# Patient Record
Sex: Female | Born: 1960
Health system: Southern US, Community
[De-identification: ages and names within clinical notes are randomized; demographics above are authoritative.]

## PROBLEM LIST (undated history)

## (undated) DIAGNOSIS — M797 Fibromyalgia: Secondary | ICD-10-CM

## (undated) DIAGNOSIS — E7800 Pure hypercholesterolemia, unspecified: Secondary | ICD-10-CM

## (undated) DIAGNOSIS — I10 Essential (primary) hypertension: Secondary | ICD-10-CM

## (undated) DIAGNOSIS — N12 Tubulo-interstitial nephritis, not specified as acute or chronic: Secondary | ICD-10-CM

## (undated) HISTORY — PX: ABDOMINAL HYSTERECTOMY: SHX81

---

## 2001-03-09 ENCOUNTER — Emergency Department (HOSPITAL_COMMUNITY): Admission: EM | Admit: 2001-03-09 | Discharge: 2001-03-09 | Payer: Self-pay | Admitting: *Deleted

## 2001-03-09 ENCOUNTER — Encounter: Payer: Self-pay | Admitting: *Deleted

## 2002-07-17 ENCOUNTER — Encounter: Payer: Self-pay | Admitting: Emergency Medicine

## 2002-07-17 ENCOUNTER — Emergency Department (HOSPITAL_COMMUNITY): Admission: EM | Admit: 2002-07-17 | Discharge: 2002-07-17 | Payer: Self-pay | Admitting: Emergency Medicine

## 2003-06-15 ENCOUNTER — Encounter: Admission: RE | Admit: 2003-06-15 | Discharge: 2003-06-15 | Payer: Self-pay | Admitting: Family Medicine

## 2007-03-12 ENCOUNTER — Encounter: Admission: RE | Admit: 2007-03-12 | Discharge: 2007-03-12 | Payer: Self-pay | Admitting: Family Medicine

## 2008-03-17 ENCOUNTER — Encounter: Admission: RE | Admit: 2008-03-17 | Discharge: 2008-03-17 | Payer: Self-pay | Admitting: Obstetrics & Gynecology

## 2009-03-20 ENCOUNTER — Encounter: Admission: RE | Admit: 2009-03-20 | Discharge: 2009-03-20 | Payer: Self-pay | Admitting: Family Medicine

## 2009-03-30 ENCOUNTER — Encounter: Admission: RE | Admit: 2009-03-30 | Discharge: 2009-03-30 | Payer: Self-pay | Admitting: Obstetrics & Gynecology

## 2010-03-26 ENCOUNTER — Encounter: Admission: RE | Admit: 2010-03-26 | Discharge: 2010-03-26 | Payer: Self-pay | Admitting: Obstetrics & Gynecology

## 2012-02-10 ENCOUNTER — Observation Stay (HOSPITAL_COMMUNITY)
Admission: EM | Admit: 2012-02-10 | Discharge: 2012-02-12 | Disposition: A | Discharge status: Home / Self Care | Payer: BC Managed Care – PPO | Attending: Internal Medicine | Admitting: Internal Medicine

## 2012-02-10 DIAGNOSIS — R4182 Altered mental status, unspecified: Principal | ICD-10-CM | POA: Insufficient documentation

## 2012-02-10 DIAGNOSIS — R4701 Aphasia: Secondary | ICD-10-CM | POA: Insufficient documentation

## 2012-02-10 DIAGNOSIS — E785 Hyperlipidemia, unspecified: Secondary | ICD-10-CM | POA: Insufficient documentation

## 2012-02-10 DIAGNOSIS — I6789 Other cerebrovascular disease: Secondary | ICD-10-CM | POA: Insufficient documentation

## 2012-02-10 DIAGNOSIS — IMO0001 Reserved for inherently not codable concepts without codable children: Secondary | ICD-10-CM

## 2012-02-10 DIAGNOSIS — R569 Unspecified convulsions: Secondary | ICD-10-CM | POA: Diagnosis present

## 2012-02-10 HISTORY — DX: Fibromyalgia: M79.7

## 2012-02-10 NOTE — ED Notes (Signed)
Dr. Dierdre Highman is aware of the patient's presentation.

## 2012-02-10 NOTE — ED Notes (Signed)
Per EMS, the patient's son said that the patient was dressing a wound on his knee when she said she had to go to the bathroom.  Sometime thereafter, the patient's family found her on the floor or the bathroom intermittently breathing rapidly and holding her breath while fluttering her eyes.

## 2012-02-10 NOTE — ED Notes (Signed)
The patient's family says she has no history of having had this problem in the past.

## 2012-02-10 NOTE — ED Notes (Signed)
The patient's son says that the patient has no mental health or neurological history.

## 2012-02-11 ENCOUNTER — Emergency Department (HOSPITAL_COMMUNITY): Payer: BC Managed Care – PPO

## 2012-02-11 ENCOUNTER — Observation Stay (HOSPITAL_COMMUNITY): Payer: BC Managed Care – PPO

## 2012-02-11 ENCOUNTER — Encounter (HOSPITAL_COMMUNITY): Payer: Self-pay | Admitting: *Deleted

## 2012-02-11 DIAGNOSIS — R030 Elevated blood-pressure reading, without diagnosis of hypertension: Secondary | ICD-10-CM

## 2012-02-11 DIAGNOSIS — IMO0001 Reserved for inherently not codable concepts without codable children: Secondary | ICD-10-CM | POA: Diagnosis present

## 2012-02-11 DIAGNOSIS — R4182 Altered mental status, unspecified: Secondary | ICD-10-CM | POA: Diagnosis present

## 2012-02-11 DIAGNOSIS — R569 Unspecified convulsions: Secondary | ICD-10-CM

## 2012-02-11 DIAGNOSIS — R4701 Aphasia: Secondary | ICD-10-CM | POA: Diagnosis present

## 2012-02-11 LAB — CBC
Hemoglobin: 12.6 g/dL (ref 12.0–15.0)
MCH: 28.2 pg (ref 26.0–34.0)
MCHC: 34.9 g/dL (ref 30.0–36.0)
MCV: 80.8 fL (ref 78.0–100.0)
Platelets: 238 10*3/uL (ref 150–400)

## 2012-02-11 LAB — COMPREHENSIVE METABOLIC PANEL
CO2: 26 mEq/L (ref 19–32)
Calcium: 9.3 mg/dL (ref 8.4–10.5)
GFR calc Af Amer: >90 mL/min (ref >90)
GFR calc non Af Amer: 84 mL/min — ABNORMAL LOW (ref >90)
Glucose, Bld: 123 mg/dL — ABNORMAL HIGH (ref 70–99)
Potassium: 3.5 mEq/L (ref 3.5–5.1)
Sodium: 140 mEq/L (ref 135–145)
Total Protein: 7 g/dL (ref 6.0–8.3)

## 2012-02-11 LAB — URINALYSIS, ROUTINE W REFLEX MICROSCOPIC
Hgb urine dipstick: NEGATIVE
Ketones, ur: NEGATIVE mg/dL
Leukocytes, UA: NEGATIVE
Nitrite: NEGATIVE
Protein, ur: NEGATIVE mg/dL
Urobilinogen, UA: 0.2 mg/dL (ref 0.0–1.0)

## 2012-02-11 LAB — RAPID URINE DRUG SCREEN, HOSP PERFORMED: Amphetamines: NOT DETECTED

## 2012-02-11 LAB — CK: Total CK: 114 U/L (ref 7–177)

## 2012-02-11 MED ORDER — SODIUM CHLORIDE 0.9 % IJ SOLN
3.0000 mL | Freq: Two times a day (BID) | INTRAMUSCULAR | Status: DC
  Administered 2012-02-12: 3 mL via INTRAVENOUS

## 2012-02-11 MED ORDER — SODIUM CHLORIDE 0.9 % IV SOLN
INTRAVENOUS | Status: DC
  Administered 2012-02-11: 01:00:00 via INTRAVENOUS

## 2012-02-11 MED ORDER — SODIUM CHLORIDE 0.9 % IJ SOLN: 3.0000 mL | INTRAMUSCULAR | Status: DC | PRN

## 2012-02-11 MED ORDER — ONDANSETRON HCL 4 MG PO TABS: 4.0000 mg | ORAL_TABLET | Freq: Four times a day (QID) | ORAL | Status: DC | PRN

## 2012-02-11 MED ORDER — SODIUM CHLORIDE 0.9 % IJ SOLN
3.0000 mL | Freq: Two times a day (BID) | INTRAMUSCULAR | Status: DC
  Administered 2012-02-11 – 2012-02-12 (×2): 3 mL via INTRAVENOUS

## 2012-02-11 MED ORDER — ONDANSETRON HCL 4 MG/2ML IJ SOLN: 4.0000 mg | Freq: Four times a day (QID) | INTRAMUSCULAR | Status: DC | PRN

## 2012-02-11 MED ORDER — LORAZEPAM 2 MG/ML IJ SOLN
1.0000 mg | Freq: Once | INTRAMUSCULAR | Status: AC
  Administered 2012-02-12: 1 mg via INTRAVENOUS
  Filled 2012-02-11: qty 1

## 2012-02-11 MED ORDER — SODIUM CHLORIDE 0.9 % IV SOLN: 250.0000 mL | INTRAVENOUS | Status: DC | PRN

## 2012-02-11 NOTE — Progress Notes (Signed)
Utilization review complete 

## 2012-02-11 NOTE — ED Notes (Signed)
Patient transported to CT 

## 2012-02-11 NOTE — ED Provider Notes (Signed)
History     CSN: [id number]  Arrival date & time 02/10/12  2325   First MD Initiated Contact with Patient 02/11/12 0030      Chief Complaint  Patient presents with  . Altered Mental Status    (Consider location/radiation/quality/duration/timing/severity/associated sxs/prior treatment) HPI HX per family bedside and limited history per PT.  AT home tonight watching TV and after 10pm was changing her son's knee bandages when she became nauseated and went to the bathroom.  Her husband found her in the bathroom on the floor, some twitchng and questionable shaking activity and unable to speak.  EMS was called and brought to the ED. PT able to shake her head Y/N and text with her family but anble to speak.  She does not recall events since being the bathroom. She denies HA, weakness, numbness, or any h/o same.  No past medical history on file.  No past surgical history on file.  No family history on file.  History  Substance Use Topics  . Smoking status: Not on file  . Smokeless tobacco: Not on file  . Alcohol Use: Not on file    OB History    No data available      Review of Systems  Constitutional: Negative for fever and chills.  HENT: Negative for neck pain and neck stiffness.   Eyes: Negative for pain.  Respiratory: Negative for shortness of breath.   Cardiovascular: Negative for chest pain.  Gastrointestinal: Negative for abdominal pain.  Genitourinary: Negative for dysuria.  Musculoskeletal: Negative for back pain.  Skin: Negative for rash.  Neurological: Positive for speech difficulty. Negative for weakness and headaches.  All other systems reviewed and are negative.    Allergies  Amoxicillin  Home Medications  No current outpatient prescriptions on file.  BP 146/77  Pulse 89  Temp 99 F (37.2 C) (Oral)  Resp 15  Ht 5' 2.5" (1.588 m)  Wt 130 lb (58.968 kg)  BMI 23.40 kg/m2  SpO2 99%  Physical Exam  Constitutional: She appears well-developed and  well-nourished.  HENT:  Head: Normocephalic and atraumatic.  Eyes: Conjunctivae normal and EOM are normal. Pupils are equal, round, and reactive to light.  Neck: Trachea normal. Neck supple. No thyromegaly present.  Cardiovascular: Normal rate, regular rhythm, S1 normal, S2 normal and normal pulses.     No systolic murmur is present   No diastolic murmur is present  Pulses:      Radial pulses are 2+ on the right side, and 2+ on the left side.  Pulmonary/Chest: Effort normal and breath sounds normal. She has no wheezes. She has no rhonchi. She has no rales. She exhibits no tenderness.  Abdominal: Soft. Normal appearance and bowel sounds are normal. There is no tenderness. There is no CVA tenderness and negative Murphy's sign.  Musculoskeletal:       MAE x 4 without deficit  Neurological: She is alert. She has normal strength. She displays a negative Romberg sign. Coordination normal. GCS eye subscore is 4. GCS verbal subscore is 1. GCS motor subscore is 6.       Other than unable to speak, has normal neuro exam. She is able to shake her head yes and no to appropriately answer questions, she is actively texting bedside with her family.   Skin: Skin is warm and dry. No rash noted. She is not diaphoretic.    ED Course  Procedures (including critical care time)   Results for orders placed during the hospital encounter of  02/10/12  URINALYSIS, ROUTINE W REFLEX MICROSCOPIC      Component Value Range   Color, Urine YELLOW  YELLOW   APPearance CLEAR  CLEAR   Specific Gravity, Urine 1.011  1.005 - 1.030   pH 6.0  5.0 - 8.0   Glucose, UA NEGATIVE  NEGATIVE mg/dL   Hgb urine dipstick NEGATIVE  NEGATIVE   Bilirubin Urine NEGATIVE  NEGATIVE   Ketones, ur NEGATIVE  NEGATIVE mg/dL   Protein, ur NEGATIVE  NEGATIVE mg/dL   Urobilinogen, UA 0.2  0.0 - 1.0 mg/dL   Nitrite NEGATIVE  NEGATIVE   Leukocytes, UA NEGATIVE  NEGATIVE  URINE RAPID DRUG SCREEN (HOSP PERFORMED)      Component Value Range    Opiates NONE DETECTED  NONE DETECTED   Cocaine NONE DETECTED  NONE DETECTED   Benzodiazepines NONE DETECTED  NONE DETECTED   Amphetamines NONE DETECTED  NONE DETECTED   Tetrahydrocannabinol NONE DETECTED  NONE DETECTED   Barbiturates NONE DETECTED  NONE DETECTED  CBC      Component Value Range   WBC 4.9  4.0 - 10.5 K/uL   RBC 4.47  3.87 - 5.11 MIL/uL   Hemoglobin 12.6  12.0 - 15.0 g/dL   HCT 82.9  56.2 - 13.0 %   MCV 80.8  78.0 - 100.0 fL   MCH 28.2  26.0 - 34.0 pg   MCHC 34.9  30.0 - 36.0 g/dL   RDW 86.5  78.4 - 69.6 %   Platelets 238  150 - 400 K/uL  COMPREHENSIVE METABOLIC PANEL      Component Value Range   Sodium 140  135 - 145 mEq/L   Potassium 3.5  3.5 - 5.1 mEq/L   Chloride 104  96 - 112 mEq/L   CO2 26  19 - 32 mEq/L   Glucose, Bld 123 (*) 70 - 99 mg/dL   BUN 9  6 - 23 mg/dL   Creatinine, Ser 2.95  0.50 - 1.10 mg/dL   Calcium 9.3  8.4 - 28.4 mg/dL   Total Protein 7.0  6.0 - 8.3 g/dL   Albumin 4.3  3.5 - 5.2 g/dL   AST 16  0 - 37 U/L   ALT 11  0 - 35 U/L   Alkaline Phosphatase 81  39 - 117 U/L   Total Bilirubin 0.6  0.3 - 1.2 mg/dL   GFR calc non Af Amer 84 (*) >90 mL/min   GFR calc Af Amer >90  >90 mL/min   Ct Head Wo Contrast  02/11/2012  *RADIOLOGY REPORT*  Clinical Data: Altered mental status.  Possible seizure.  Found down.  CT HEAD WITHOUT CONTRAST  Technique:  Contiguous axial images were obtained from the base of the skull through the vertex without contrast.  Comparison: None.  Findings: No mass lesion, mass effect, midline shift, hydrocephalus, hemorrhage.  No territorial ischemia or acute infarction.  Paranasal sinuses appear within normal limits.  IMPRESSION: Negative CT head.   Original Report Authenticated By: Andreas Newport, M.D.    Dg Chest Portable 1 View  02/11/2012  *RADIOLOGY REPORT*  Clinical Data: 51 year old female with altered mental status.  PORTABLE CHEST - 1 VIEW  Comparison: None.  Findings: Portable semi upright AP view 0011 hours.  Normal  lung volumes.  Cardiac size and mediastinal contours are within normal limits.  Visualized tracheal air column is within normal limits. EKG leads and wires overlie the chest.  No pneumothorax.  Allowing for portable technique, the lungs are clear.  IMPRESSION: Negative, no acute cardiopulmonary abnormality.   Original Report Authenticated By: Harley Hallmark, M.D.      Date: 02/11/2012  Rate: 98  Rhythm: normal sinus rhythm  QRS Axis: normal  Intervals: normal  ST/T Wave abnormalities: nonspecific ST changes  Conduction Disutrbances:none  Narrative Interpretation:   Old EKG Reviewed: none available  1:29 AM exam unchanged  Not a tPA candidate given possible seizure activity.   D/w NEU Dr Amada Jupiter on call, evaluated bedside and recs admit for MRI and EEG in the am.   2:17 AM d/w Dr Onalee Hua- will admit tele  MDM   Otherwise healthy 51 yo female with loss of speech after possible seizure. VS and nursing notes reviewed. UA/ UDS. Labs CT brain and CXR reviewed as above. NEU consult and MED admit.         Sunnie Nielsen, MD 02/11/12 [phone]

## 2012-02-11 NOTE — H&P (Signed)
PCP:   Ailene Ravel, MD   Chief Complaint:  seizure  HPI: 51 yo female healthy got nauseated earlier went to bathroom to vomit but didn't then started shaking all over and was found down on ground by husband and dtr.  No urinary or bowel incont.  No bites of tongue.  ems called came to ED and was aphasic for several hours but was able to text with family members in room by phone.  Several hours later i saw pt and her speech/voice was back to normal.  She recalls specifics of everything that happened during the seizure like activity tonight.  She recalls the sequence of events vividly.  No recent illness/ no fevers/diarrhea.  No sob/cp/rashes.  Review of Systems:  O/w neg  Past Medical History: none   Medications: Prior to Admission medications   Not on File    Allergies:   Allergies  Allergen Reactions  . Amoxicillin Hives    Social History:  does not have a smoking history on file. She does not have any smokeless tobacco history on file. Her alcohol and drug histories not on file.  Family History: neg  Physical Exam: Filed Vitals:   02/10/12 2332 02/10/12 2336 02/10/12 2345 02/10/12 2356  BP:  143/89 146/77   Pulse:  97 89   Temp:  99 F (37.2 C)    TempSrc:  Oral    Resp:  15 15   Height:    5' 2.5" (1.588 m)  Weight:    58.968 kg (130 lb)  SpO2: 98% 99% 99%    General appearance: alert, cooperative and no distress Neck: no carotid bruit and no JVD Lungs: clear to auscultation bilaterally Heart: regular rate and rhythm, S1, S2 normal, no murmur, click, rub or gallop Abdomen: soft, non-tender; bowel sounds normal; no masses,  no organomegaly Extremities: extremities normal, atraumatic, no cyanosis or edema Pulses: 2+ and symmetric Skin: Skin color, texture, turgor normal. No rashes or lesions Neurologic: Grossly normal    Labs on Admission:   [facility] 02/11/12 0009  NA 140  K 3.5  CL 104  CO2 26  GLUCOSE 123*  BUN 9  CREATININE 0.80  CALCIUM  9.3  MG --  PHOS --    Basename 02/11/12 0009  AST 16  ALT 11  ALKPHOS 81  BILITOT 0.6  PROT 7.0  ALBUMIN 4.3    Basename 02/11/12 0009  WBC 4.9  NEUTROABS --  HGB 12.6  HCT 36.1  MCV 80.8  PLT 238   Radiological Exams on Admission: Ct Head Wo Contrast  02/11/2012  *RADIOLOGY REPORT*  Clinical Data: Altered mental status.  Possible seizure.  Found down.  CT HEAD WITHOUT CONTRAST  Technique:  Contiguous axial images were obtained from the base of the skull through the vertex without contrast.  Comparison: None.  Findings: No mass lesion, mass effect, midline shift, hydrocephalus, hemorrhage.  No territorial ischemia or acute infarction.  Paranasal sinuses appear within normal limits.  IMPRESSION: Negative CT head.   Original Report Authenticated By: Andreas Newport, M.D.    Dg Chest Portable 1 View  02/11/2012  *RADIOLOGY REPORT*  Clinical Data: 51 year old female with altered mental status.  PORTABLE CHEST - 1 VIEW  Comparison: None.  Findings: Portable semi upright AP view 0011 hours.  Normal lung volumes.  Cardiac size and mediastinal contours are within normal limits.  Visualized tracheal air column is within normal limits. EKG leads and wires overlie the chest.  No pneumothorax.  Allowing for portable  technique, the lungs are clear.  IMPRESSION: Negative, no acute cardiopulmonary abnormality.   Original Report Authenticated By: Harley Hallmark, M.D.     Assessment/Plan Present on Admission:  51 yo female with? Seizure like activity and aphasia now resolved .Aphasia .Observed seizure-like activity .Altered mental state .Elevated BP  Neuro eval underway.  W/u per neuro.  No focal def at this time.  Danuel Felicetti [phone] 02/11/2012, 2:37 AM

## 2012-02-11 NOTE — ED Notes (Signed)
MD at bedside. 

## 2012-02-11 NOTE — Consult Note (Signed)
Reason for Consult: Inability to speak.  Referring Physician: Sunnie Nielsen  CC: Inability to Speak.   History is obtained from:Patient, family  HPI: Cheyenne Hart is an 51 y.o. female who was in her normal state of health until a few hours ago at which point she had gone to the bathroom complaining of nausea. She was then seen to pass out and have shaking. She does not feel that she lost consciousness, but remembers the shaking throughout the event.  Since that time, she has been able to interact via texting, but has not been able to have any verbal output. She is able to nod and shake her head appropriately, she is also able to give finger symbols such as when asked to describe, and fingers I'm holding up.   She also has right-sided numbness.  ROS: An 11 point ROS was performed and is negative except as noted in the HPI.  Past medical history: None  Family History: No history of seizures Father died of MI  Social History: Tob: Denies.   Exam: Current vital signs: BP 146/77  Pulse 89  Temp 99 F (37.2 C) (Oral)  Resp 15  Ht 5' 2.5" (1.588 m)  Wt 58.968 kg (130 lb)  BMI 23.40 kg/m2  SpO2 99% Vital signs in last 24 hours: Temp:  [99 F (37.2 C)] 99 F (37.2 C) (09/23 2336) Pulse Rate:  [89-97] 89  (09/23 2345) Resp:  [15] 15  (09/23 2345) BP: (143-146)/(77-89) 146/77 mmHg (09/23 2345) SpO2:  [98 %-99 %] 99 % (09/23 2345) Weight:  [58.968 kg (130 lb)] 58.968 kg (130 lb) (09/23 2356)  General: In bed, typing on cell phone CV: Regular rate and rhythm Mental Status: Patient is awake, alert, able to answer yes or no questions appropriately with head shakes. Able to text quickly and accurately. Patient is able to name objects, understands quickly and appropriately follows commands.  When asked to try and mouth the word, she is unable to. She is also unable to phonate sounds.  Cranial Nerves: II: Visual Fields are full. Pupils are equal, round, and reactive to light.   Discs are difficult visual. III,IV, VI: EOMI without ptosis or diploplia.  V,VII: Facial sensation and movement are symmetric.  VIII: hearing is intact to voice X: Uvula elevates symmetrically XI: Shoulder shrug is symmetric. XII: tongue is midline without atrophy or fasciculations.  Motor: Tone is normal. Bulk is normal. 5/5 strength was present in all four extremities.  Sensory: Sensation is diminished to light touch and temperature in the right arm, leg, and face. She does NOT split the midline. Deep Tendon Reflexes: 2+ and symmetric in the biceps and patellae. Plantars: Toes are downgoing bilaterally.  Cerebellar: FNF  intact bilaterally Gait: Did not assess due to patient safety concerns in the setting of multiple ER monitors  I have reviewed labs in epic and the results pertinent to this consultation are: CMP, CBC, UA, UDS are all negative  I have reviewed the images obtained: CT head is negative  Impression: 51 year old female with abrupt onset of shaking, now with persistent inability to speak but no signs of aphasia given that she can communicate via texting without problem. The fact that she does have a right-sided hypesthesia makes me concerned that she could possibly have a speech apraxia from a left sided infarct. My suspicion, however, is that this represents conversion disorder. The shaking on both sides accompanied by no loss of consciousness and full memory is not consistent with seizure  activity.  Recommendations: 1) MRI brain 2) EEG 3) If these are normal, I do not feel that any further workup is necessary.    Ritta Slot, MD [facility] [phone]

## 2012-02-11 NOTE — Progress Notes (Signed)
[facility] PROGRESS NOTE  Cheyenne Hart WUJ:[id number] DOB: September 14, 1960 DOA: 02/10/2012 PCP: Ailene Ravel, MD  Assessment/Plan: Principal Problem:  *Observed seizure-like activity Active Problems:  Aphasia  Altered mental state  Elevated BP  1. Abrupt onset of shaking and inability to speak ??aphasia: Rule out CVA.? seizure like activity. Patient denies worse now or family history of seizures. Denies any stressors. Not started recently on any new medications. Presenting symptoms have resolved. Denies losing consciousness. Neurology consultation appreciated. Follow MRI brain and EEG. UA and UDS are negative. Will add CK to labs.  Code Status: Full Family Communication: Discussed directly with patient. Disposition Plan: Home when workup complete.   Brief narrative: 51 yo female healthy got nauseated earlier went to bathroom to vomit but didn't then started shaking all over and laid down on the floor and was found there by husband and dtr. No urinary or bowel incont. No bites of tongue. ems called came to ED and was aphasic for several hours but was able to text with family members in room by phone. Several hours later in the ED, while evaluating by admitting M.D., was back to normal. She recalls specifics of everything that happened during the seizure like activity. She recalls the sequence of events vividly.   Consultants:  Neurology  Procedures:  None  Antibiotics:  None  HPI/Subjective: Denies complaints. No further symptoms of admission.  Objective: Filed Vitals:   02/11/12 0330 02/11/12 0530 02/11/12 0647 02/11/12 0800  BP: 126/76 127/80 125/80 119/80  Pulse: 76 68 69 65  Temp:  97.9 F (36.6 C) 98.1 F (36.7 C) 97.9 F (36.6 C)  TempSrc:  Oral    Resp: 14 16 18 18   Height:   5' 2.5" (1.588 m)   Weight:   57.788 kg (127 lb 6.4 oz)   SpO2: 97% 100% 96% 98%   No intake or output data in the 24 hours ending 02/11/12 1144 Filed Weights   02/10/12 2356  02/11/12 0647  Weight: 58.968 kg (130 lb) 57.788 kg (127 lb 6.4 oz)    Exam:   General exam: Moderately built and nourished female lying comfortably supine in bed.  Respiratory system: Clear to auscultation. No increased work of breathing.  Cardiovascular system: S1 and S2 heard, regular rate and rhythm. No JVD, murmurs or pedal edema. Telemetry shows normal sinus rhythm.  Gastrointestinal system: Abdomen is nondistended, soft and nontender. Normal bowel sounds heard.  Central nervous system: Alert and oriented. No focal neurological deficits.  Extremities: Symmetric 5 x 5 power.  Psychiatry: Pleasant and cooperative.  Data Reviewed: Basic Metabolic Panel:  Lab 02/11/12 9562  NA 140  K 3.5  CL 104  CO2 26  GLUCOSE 123*  BUN 9  CREATININE 0.80  CALCIUM 9.3  MG --  PHOS --   Liver Function Tests:  Lab 02/11/12 0009  AST 16  ALT 11  ALKPHOS 81  BILITOT 0.6  PROT 7.0  ALBUMIN 4.3   No results found for this basename: LIPASE:5,AMYLASE:5 in the last 168 hours No results found for this basename: AMMONIA:5 in the last 168 hours CBC:  Lab 02/11/12 0009  WBC 4.9  NEUTROABS --  HGB 12.6  HCT 36.1  MCV 80.8  PLT 238   Cardiac Enzymes: No results found for this basename: CKTOTAL:5,CKMB:5,CKMBINDEX:5,TROPONINI:5 in the last 168 hours BNP (last 3 results) No results found for this basename: PROBNP:3 in the last 8760 hours CBG: No results found for this basename: GLUCAP:5 in the last 168 hours  No results found for this or any previous visit (from the past 240 hour(s)).   Studies: Ct Head Wo Contrast  02/11/2012  *RADIOLOGY REPORT*  Clinical Data: Altered mental status.  Possible seizure.  Found down.  CT HEAD WITHOUT CONTRAST  Technique:  Contiguous axial images were obtained from the base of the skull through the vertex without contrast.  Comparison: None.  Findings: No mass lesion, mass effect, midline shift, hydrocephalus, hemorrhage.  No territorial ischemia  or acute infarction.  Paranasal sinuses appear within normal limits.  IMPRESSION: Negative CT head.   Original Report Authenticated By: Andreas Newport, M.D.    Dg Chest Portable 1 View  02/11/2012  *RADIOLOGY REPORT*  Clinical Data: 51 year old female with altered mental status.  PORTABLE CHEST - 1 VIEW  Comparison: None.  Findings: Portable semi upright AP view 0011 hours.  Normal lung volumes.  Cardiac size and mediastinal contours are within normal limits.  Visualized tracheal air column is within normal limits. EKG leads and wires overlie the chest.  No pneumothorax.  Allowing for portable technique, the lungs are clear.  IMPRESSION: Negative, no acute cardiopulmonary abnormality.   Original Report Authenticated By: Harley Hallmark, M.D.     Scheduled Meds:    . LORazepam  1 mg Intravenous Once  . sodium chloride  3 mL Intravenous Q12H  . sodium chloride  3 mL Intravenous Q12H   Continuous Infusions:    . DISCONTD: sodium chloride 125 mL/hr at 02/11/12 0102     [facility]  [facility] Pager [phone]. If 8PM-8AM, please contact night-coverage at [email or web address], password [email or web address] 02/11/2012, 11:44 AM  LOS: 1 day

## 2012-02-11 NOTE — Progress Notes (Signed)
EEG completed as ordered.

## 2012-02-12 ENCOUNTER — Observation Stay (HOSPITAL_COMMUNITY): Payer: BC Managed Care – PPO

## 2012-02-12 LAB — POCT I-STAT, CHEM 8
BUN: 8 mg/dL (ref 6–23)
Calcium, Ion: 1.15 mmol/L (ref 1.12–1.23)
HCT: 37 % (ref 36.0–46.0)
TCO2: 24 mmol/L (ref 0–100)

## 2012-02-12 MED ORDER — ASPIRIN 81 MG PO TBEC: 81.0000 mg | DELAYED_RELEASE_TABLET | Freq: Every day | ORAL | Status: AC

## 2012-02-12 MED ORDER — SIMVASTATIN 10 MG PO TABS: 10.0000 mg | ORAL_TABLET | Freq: Every day | ORAL | Status: AC

## 2012-02-12 NOTE — Discharge Summary (Signed)
Physician Discharge Summary  Cheyenne Hart:[id number] DOB: March 04, 1961 DOA: 02/10/2012  PCP: Ailene Ravel, MD  Admit date: 02/10/2012 Discharge date: 02/12/2012   Discharge Diagnoses:  Episode of loss of consciousness Chronic small vessel disease on MRI of the brain Reported hyperlipidemia -- no LDL done during this admission History of vasovagal events   Discharge Condition: Good, neurologically intact, back to baseline  Diet recommendation: Heart healthy  Filed Weights   02/10/12 2356 02/11/12 0647  Weight: 58.968 kg (130 lb) 57.788 kg (127 lb 6.4 oz)    History of present illness:  51 yo female healthy got nauseated earlier went to bathroom to vomit but didn't then started shaking all over and was found down on ground by husband and dtr. No urinary or bowel incont. No bites of tongue. ems called came to ED and was aphasic for several hours but was able to text with family members in room by phone. Several hours later i saw pt and her speech/voice was back to normal. She recalls specifics of everything that happened during the seizure like activity tonight. She recalls the sequence of events vividly. No recent illness/ no fevers/diarrhea. No sob/cp/rashes.   Hospital Course:  1. episode of loss of consciousness at home - the patient was seen by neurology in the emergency room and their note clearly states that her episode is highly unlikely to be a seizure. Patient was observed in the hospital for 48 hours without any loss of consciousness. She remained neurologically intact without any focal deficits. In retrospect the episode was probably a vasovagal event with superimposed stress reaction. 2. Chronic small vessel disease observed on MRI of the brain - probably due to hyperlipidemia, which patient reports has been persistent for years. Recommended daily aspirin and statin  Procedures:  MRI brain  Consultations:  Neurology  Discharge Exam: Filed Vitals:   02/12/12 0000  02/12/12 0400 02/12/12 0742 02/12/12 1158  BP: 127/75 124/88 136/78 127/81  Pulse: 83 58 75 86  Temp: 98 F (36.7 C) 98.1 F (36.7 C) 98.5 F (36.9 C) 98.1 F (36.7 C)  TempSrc: Oral Oral Oral Oral  Resp: 15 15 16 16   Height:      Weight:      SpO2: 95% 99% 100% 100%    General: Alert and oriented x3 Cardiovascular: Regular rate and rhythm Respiratory: Clear to auscultation bilaterally  Discharge Instructions  Discharge Orders    Future Orders Please Complete By Expires   Diet - low sodium heart healthy      Increase activity slowly          Medication List     As of 02/12/2012  2:23 PM    TAKE these medications         aspirin 81 MG EC tablet   Take 1 tablet (81 mg total) by mouth daily. Swallow whole.      simvastatin 10 MG tablet   Commonly known as: ZOCOR   Take 1 tablet (10 mg total) by mouth at bedtime.           Follow-up Information    Follow up with Ailene Ravel, MD.   Contact information:   Dr. Burnell Blanks [address] [place] [address] [phone]           The results of significant diagnostics from this hospitalization (including imaging, microbiology, ancillary and laboratory) are listed below for reference.    Significant Diagnostic Studies: Ct Head Wo Contrast  02/11/2012  *RADIOLOGY REPORT*  Clinical Data: Altered mental status.  Possible seizure.  Found down.  CT HEAD WITHOUT CONTRAST  Technique:  Contiguous axial images were obtained from the base of the skull through the vertex without contrast.  Comparison: None.  Findings: No mass lesion, mass effect, midline shift, hydrocephalus, hemorrhage.  No territorial ischemia or acute infarction.  Paranasal sinuses appear within normal limits.  IMPRESSION: Negative CT head.   Original Report Authenticated By: Andreas Newport, M.D.    Mr Brain Wo Contrast  02/12/2012  *RADIOLOGY REPORT*  Clinical Data:  Seizure.  Difficulty talking.  MRI HEAD WITHOUT CONTRAST  Technique:   Multiplanar, multiecho pulse sequences of the brain and surrounding structures were obtained according to standard protocol without intravenous contrast.  Comparison: CT scan of the brain  02/11/2012.  Findings:  Gray-white matter differentiation is normal.  Partial empty sella is seen.  Clival signal is intact.  Cerebellar tonsils are at the level of the foramen magnum.  The odontoid process, the predental space and the prevertebral soft tissues are within normal limits.  No diffusion weighted abnormalities seen.  Axial FLAIR and T2-weighted images demonstrate a few scattered foci of signal hyperintensity in the subcortical white matter bifrontally, and the biparietal regions.  The para hippocampal regions of the temporal lobes demonstrate symmetrical signal and morphology.  Ventricles are normal.  Mastoids are clear.  The internal auditory canals are symmetrical in signal and morphology.  Flow voids are maintained in the major vessels at the cranial skull base.  There is mild thickening of the mucosa in the ethmoid air cells and the maxillary sinuses.  The visualized orbital contents are grossly normal.  No abnormal blood breakdown products seen on SPGR sequences.  IMPRESSION: 1. No evidence of acute ischemia. 2.  A few scattered nonspecific subcortical white matter signal hyperintensities supratentorially. Differential considerations would be areas of ischemic gliosis related to small vessel disease due to hypertension and/or diabetes versus a demyelinating process or vasculitis.  Similar changes may be seen with chronic vascular headaches. Clinical correlation  is suggested. 3.  Minimal inflammatory mucosal reaction in the paranasal sinuses.   Original Report Authenticated By: Oneal Grout, M.D.    Dg Chest Portable 1 View  02/11/2012  *RADIOLOGY REPORT*  Clinical Data: 51 year old female with altered mental status.  PORTABLE CHEST - 1 VIEW  Comparison: None.  Findings: Portable semi upright AP view  0011 hours.  Normal lung volumes.  Cardiac size and mediastinal contours are within normal limits.  Visualized tracheal air column is within normal limits. EKG leads and wires overlie the chest.  No pneumothorax.  Allowing for portable technique, the lungs are clear.  IMPRESSION: Negative, no acute cardiopulmonary abnormality.   Original Report Authenticated By: Harley Hallmark, M.D.     Microbiology: No results found for this or any previous visit (from the past 240 hour(s)).   Labs: Basic Metabolic Panel:  Lab 02/11/12 5573 02/11/12 0009  NA 141 140  K 3.7 3.5  CL 104 104  CO2 -- 26  GLUCOSE 117* 123*  BUN 8 9  CREATININE 0.90 0.80  CALCIUM -- 9.3  MG -- --  PHOS -- --   Liver Function Tests:  Lab 02/11/12 0009  AST 16  ALT 11  ALKPHOS 81  BILITOT 0.6  PROT 7.0  ALBUMIN 4.3   No results found for this basename: LIPASE:5,AMYLASE:5 in the last 168 hours No results found for this basename: AMMONIA:5 in the last 168 hours CBC:  Lab 02/11/12 0024  02/11/12 0009  WBC -- 4.9  NEUTROABS -- --  HGB 12.6 12.6  HCT 37.0 36.1  MCV -- 80.8  PLT -- 238   Cardiac Enzymes:  Lab 02/11/12 1351  CKTOTAL 114  CKMB --  CKMBINDEX --  TROPONINI --   BNP: BNP (last 3 results) No results found for this basename: PROBNP:3 in the last 8760 hours CBG: No results found for this basename: GLUCAP:5 in the last 168 hours  Time coordinating discharge: 40 minutes  Signed:  Letisha Yera  [facility] 02/12/2012, 2:23 PM

## 2012-02-12 NOTE — Procedures (Signed)
EEG  This is a  51 y/o female patient had nausea and then had loc and shaking but remembers the whole event including passing out and was shaking. She has no verbal output but able to text on her cell phone no signs of aphasia.  Shaking all over and no loss of consciousness and full memory intact does not correlate with seizure activity.  EEG was done to rule out any possible seizure focus because of inconsistency in the history  EEG: [place]  17 lead  DESCRIPTION: This is a routine EEG recording performed during  wakefulness.  Posterior dominant rhythm is approximately 8-9 hz alpha rhythm that is symmetrical. Photic stimulation was done and did provide a significant driving response to the EEG. Stage 2 sleep was captured as evidenced by K complex and Vertex sharp waves.  No epileptiform discharges or seizure like recorded.   INTERPRETATION: This EEG  Is normal wake, drowsy and sleep without the evidence of seizures or other abnormalities. Clinical correlation is highly suggested  Gergory Biello V-P Eilleen Kempf., MD., Ph.D.,MS 02/12/2012 6:22 PM

## 2012-02-12 NOTE — Progress Notes (Signed)
Pt discharged to home per MD order. Pt received all discharge instructions and medication information including follow-up appointments and prescriptions.  Pt alert and oriented at discharge with no complaints of pain.  Pt escorted to private vehicle via wheelchair by nurse tech. Coates, Ercia Crisafulli Elizabeth  

## 2012-08-24 ENCOUNTER — Other Ambulatory Visit: Payer: Self-pay | Admitting: Family Medicine

## 2012-08-24 DIAGNOSIS — Z1231 Encounter for screening mammogram for malignant neoplasm of breast: Secondary | ICD-10-CM

## 2012-09-28 ENCOUNTER — Ambulatory Visit: Payer: BC Managed Care – PPO

## 2012-10-05 ENCOUNTER — Ambulatory Visit: Payer: BC Managed Care – PPO

## 2012-10-29 ENCOUNTER — Other Ambulatory Visit: Payer: Self-pay | Admitting: Family Medicine

## 2012-10-29 DIAGNOSIS — N951 Menopausal and female climacteric states: Secondary | ICD-10-CM

## 2012-11-09 ENCOUNTER — Ambulatory Visit: Payer: BC Managed Care – PPO

## 2012-11-30 ENCOUNTER — Ambulatory Visit
Admission: RE | Admit: 2012-11-30 | Discharge: 2012-11-30 | Disposition: A | Discharge status: Home / Self Care | Payer: BC Managed Care – PPO | Source: Ambulatory Visit | Attending: Family Medicine | Admitting: Family Medicine

## 2012-11-30 DIAGNOSIS — N951 Menopausal and female climacteric states: Secondary | ICD-10-CM

## 2012-11-30 DIAGNOSIS — Z1231 Encounter for screening mammogram for malignant neoplasm of breast: Secondary | ICD-10-CM

## 2013-11-24 ENCOUNTER — Other Ambulatory Visit: Payer: Self-pay

## 2013-11-24 DIAGNOSIS — Z1231 Encounter for screening mammogram for malignant neoplasm of breast: Secondary | ICD-10-CM

## 2014-01-03 ENCOUNTER — Encounter (INDEPENDENT_AMBULATORY_CARE_PROVIDER_SITE_OTHER): Payer: Self-pay

## 2014-01-03 ENCOUNTER — Ambulatory Visit
Admission: RE | Admit: 2014-01-03 | Discharge: 2014-01-03 | Disposition: A | Discharge status: Home / Self Care | Payer: BC Managed Care – PPO | Source: Ambulatory Visit

## 2014-01-03 DIAGNOSIS — Z1231 Encounter for screening mammogram for malignant neoplasm of breast: Secondary | ICD-10-CM

## 2014-08-02 ENCOUNTER — Other Ambulatory Visit: Payer: Self-pay

## 2014-08-02 ENCOUNTER — Encounter (HOSPITAL_COMMUNITY): Payer: Self-pay | Admitting: Emergency Medicine

## 2014-08-02 ENCOUNTER — Emergency Department (HOSPITAL_COMMUNITY): Payer: BLUE CROSS/BLUE SHIELD

## 2014-08-02 ENCOUNTER — Emergency Department (HOSPITAL_COMMUNITY)
Admission: EM | Admit: 2014-08-02 | Discharge: 2014-08-02 | Disposition: A | Discharge status: Home / Self Care | Payer: BLUE CROSS/BLUE SHIELD | Attending: Emergency Medicine | Admitting: Emergency Medicine

## 2014-08-02 DIAGNOSIS — Z79899 Other long term (current) drug therapy: Secondary | ICD-10-CM | POA: Insufficient documentation

## 2014-08-02 DIAGNOSIS — Z7982 Long term (current) use of aspirin: Secondary | ICD-10-CM | POA: Insufficient documentation

## 2014-08-02 DIAGNOSIS — R079 Chest pain, unspecified: Secondary | ICD-10-CM | POA: Insufficient documentation

## 2014-08-02 DIAGNOSIS — M545 Low back pain: Secondary | ICD-10-CM | POA: Diagnosis present

## 2014-08-02 DIAGNOSIS — Z880 Allergy status to penicillin: Secondary | ICD-10-CM | POA: Insufficient documentation

## 2014-08-02 DIAGNOSIS — N12 Tubulo-interstitial nephritis, not specified as acute or chronic: Secondary | ICD-10-CM | POA: Diagnosis not present

## 2014-08-02 LAB — URINALYSIS, ROUTINE W REFLEX MICROSCOPIC
BILIRUBIN URINE: NEGATIVE
Glucose, UA: NEGATIVE mg/dL
KETONES UR: NEGATIVE mg/dL
NITRITE: POSITIVE — AB
PROTEIN: NEGATIVE mg/dL
Specific Gravity, Urine: 1.014 (ref 1.005–1.030)
UROBILINOGEN UA: 0.2 mg/dL (ref 0.0–1.0)
pH: 6.5 (ref 5.0–8.0)

## 2014-08-02 LAB — COMPREHENSIVE METABOLIC PANEL
ALK PHOS: 63 U/L (ref 39–117)
ALT: 17 U/L (ref 0–35)
ANION GAP: 9 (ref 5–15)
AST: 19 U/L (ref 0–37)
Albumin: 4.6 g/dL (ref 3.5–5.2)
BUN: 9 mg/dL (ref 6–23)
CO2: 27 mmol/L (ref 19–32)
CREATININE: 0.76 mg/dL (ref 0.50–1.10)
Calcium: 9.3 mg/dL (ref 8.4–10.5)
Chloride: 105 mmol/L (ref 96–112)
GFR calc non Af Amer: >90 mL/min (ref >90)
GLUCOSE: 117 mg/dL — AB (ref 70–99)
POTASSIUM: 4 mmol/L (ref 3.5–5.1)
Sodium: 141 mmol/L (ref 135–145)
TOTAL PROTEIN: 7.1 g/dL (ref 6.0–8.3)
Total Bilirubin: 0.7 mg/dL (ref 0.3–1.2)

## 2014-08-02 LAB — D-DIMER, QUANTITATIVE: D-Dimer, Quant: <0.27 ug/mL-FEU (ref 0.00–0.48)

## 2014-08-02 LAB — CBC
HEMATOCRIT: 38 % (ref 36.0–46.0)
HEMOGLOBIN: 12.7 g/dL (ref 12.0–15.0)
MCH: 27.8 pg (ref 26.0–34.0)
MCHC: 33.4 g/dL (ref 30.0–36.0)
MCV: 83.2 fL (ref 78.0–100.0)
Platelets: 199 10*3/uL (ref 150–400)
RBC: 4.57 MIL/uL (ref 3.87–5.11)
RDW: 12.9 % (ref 11.5–15.5)
WBC: 4 10*3/uL (ref 4.0–10.5)

## 2014-08-02 LAB — TROPONIN I

## 2014-08-02 LAB — LIPASE, BLOOD: LIPASE: 41 U/L (ref 11–59)

## 2014-08-02 LAB — URINE MICROSCOPIC-ADD ON

## 2014-08-02 MED ORDER — ONDANSETRON HCL 4 MG/2ML IJ SOLN
4.0000 mg | Freq: Once | INTRAMUSCULAR | Status: AC
  Administered 2014-08-02: 4 mg via INTRAVENOUS
  Filled 2014-08-02: qty 2

## 2014-08-02 MED ORDER — CIPROFLOXACIN HCL 500 MG PO TABS: 500.0000 mg | ORAL_TABLET | Freq: Two times a day (BID) | ORAL | Status: AC

## 2014-08-02 MED ORDER — DEXTROSE 5 % IV SOLN
1.0000 g | Freq: Once | INTRAVENOUS | Status: AC
  Administered 2014-08-02: 1 g via INTRAVENOUS
  Filled 2014-08-02: qty 10

## 2014-08-02 MED ORDER — HYDROCODONE-ACETAMINOPHEN 5-325 MG PO TABS: 1.0000 | ORAL_TABLET | Freq: Four times a day (QID) | ORAL | Status: AC | PRN

## 2014-08-02 MED ORDER — SODIUM CHLORIDE 0.9 % IV BOLUS (SEPSIS)
1000.0000 mL | Freq: Once | INTRAVENOUS | Status: AC
  Administered 2014-08-02: 1000 mL via INTRAVENOUS

## 2014-08-02 MED ORDER — ONDANSETRON 4 MG PO TBDP: 4.0000 mg | ORAL_TABLET | Freq: Three times a day (TID) | ORAL | Status: AC | PRN

## 2014-08-02 MED ORDER — DIPHENHYDRAMINE HCL 50 MG/ML IJ SOLN
25.0000 mg | Freq: Once | INTRAMUSCULAR | Status: AC
  Administered 2014-08-02: 25 mg via INTRAVENOUS
  Filled 2014-08-02: qty 1

## 2014-08-02 MED ORDER — PROCHLORPERAZINE EDISYLATE 5 MG/ML IJ SOLN
10.0000 mg | Freq: Four times a day (QID) | INTRAMUSCULAR | Status: DC | PRN
  Administered 2014-08-02: 10 mg via INTRAVENOUS
  Filled 2014-08-02: qty 2

## 2014-08-02 MED ORDER — MORPHINE SULFATE 4 MG/ML IJ SOLN
4.0000 mg | Freq: Once | INTRAMUSCULAR | Status: AC
  Administered 2014-08-02: 4 mg via INTRAVENOUS
  Filled 2014-08-02: qty 1

## 2014-08-02 NOTE — Discharge Instructions (Signed)
Pyelonephritis, Adult °Pyelonephritis is a kidney infection. A kidney infection can happen quickly, or it can last for a long time. °HOME CARE  °· Take your medicine (antibiotics) as told. Finish it even if you start to feel better. °· Keep all doctor visits as told. °· Drink enough fluids to keep your pee (urine) clear or pale yellow. °· Only take medicine as told by your doctor. °GET HELP RIGHT AWAY IF:  °· You have a fever or lasting symptoms for more than 2-3 days. °· You have a fever and your symptoms suddenly get worse. °· You cannot take your medicine or drink fluids as told. °· You have chills and shaking. °· You feel very weak or pass out (faint). °· You do not feel better after 2 days. °MAKE SURE YOU: °· Understand these instructions. °· Will watch your condition. °· Will get help right away if you are not doing well or get worse. °Document Released: 06/13/2004 Document Revised: 11/05/2011 Document Reviewed: 10/24/2010 °[employer]® Patient Information ©2015 [employer]. This information is not intended to replace advice given to you by your health care provider. Make sure you discuss any questions you have with your health care provider. ° °

## 2014-08-02 NOTE — ED Notes (Signed)
Pt presents with back pain that radiates into her chest since Sunday, admits to N/V.  Pt denies SOB, speaking in full sentences without distress.

## 2014-08-02 NOTE — ED Provider Notes (Signed)
CSN: [id number]     Arrival date & time 08/02/14  0306 History   First MD Initiated Contact with Patient 08/02/14 [phone]     Chief Complaint  Patient presents with  . Back Pain     (Consider location/radiation/quality/duration/timing/severity/associated sxs/prior Treatment) HPI  This is a 54 year old female with a history of fibromyalgia who presents with back pain. Patient reports right upper and left lower back pain onset on Sunday. She states that it is achy. It comes and goes. Currently she is pain-free but when she arrived is 10 out of 10. The upper back pain radiates into her chest. She reports nausea and vomiting. Denies any diarrhea or fevers. Denies any sick contacts. No known history of kidney stones and no urinary symptoms. No known history of heart disease. Denies history of hypertension or hyperlipidemia. She is a nonsmoker.  Past Medical History  Diagnosis Date  . Fibromyalgia    Past Surgical History  Procedure Laterality Date  . Abdominal hysterectomy     No family history on file. History  Substance Use Topics  . Smoking status: Never Smoker   . Smokeless tobacco: Never Used  . Alcohol Use: No   OB History    No data available     Review of Systems  Constitutional: Negative for fever.  Respiratory: Negative for cough, chest tightness and shortness of breath.   Cardiovascular: Positive for chest pain. Negative for leg swelling.  Gastrointestinal: Positive for nausea and vomiting. Negative for abdominal pain and diarrhea.  Genitourinary: Negative for dysuria.  Musculoskeletal: Positive for back pain.  Skin: Negative for wound.  Neurological: Negative for headaches.  Psychiatric/Behavioral: Negative for confusion.  All other systems reviewed and are negative.     Allergies  Amoxicillin  Home Medications   Prior to Admission medications   Medication Sig Start Date End Date Taking? Authorizing Provider  alendronate (FOSAMAX) 70 MG tablet Take 70 mg by  mouth once a week. Take with a full glass of water on an empty stomach.   Yes Historical Provider, MD  aspirin (ASPIRIN EC) 81 MG EC tablet Take 1 tablet (81 mg total) by mouth daily. Swallow whole. 02/12/12  Yes Sorin Luanne Bras, MD  cefUROXime (CEFTIN) 500 MG tablet Take 500 mg by mouth 2 (two) times daily.  08/01/14  Yes Historical Provider, MD  cetirizine (ZYRTEC) 10 MG tablet Take 10 mg by mouth daily.   Yes Historical Provider, MD  CHERATUSSIN AC 100-10 MG/5ML syrup Take 5 mLs by mouth 3 (three) times daily as needed for cough.  08/01/14  Yes Historical Provider, MD  simvastatin (ZOCOR) 10 MG tablet Take 1 tablet (10 mg total) by mouth at bedtime. 02/12/12  Yes Sorin Luanne Bras, MD  ciprofloxacin (CIPRO) 500 MG tablet Take 1 tablet (500 mg total) by mouth 2 (two) times daily. 08/02/14   Shon Baton, MD  HYDROcodone-acetaminophen (NORCO/VICODIN) 5-325 MG per tablet Take 1 tablet by mouth every 6 (six) hours as needed. 08/02/14   Shon Baton, MD  ondansetron (ZOFRAN ODT) 4 MG disintegrating tablet Take 1 tablet (4 mg total) by mouth every 8 (eight) hours as needed for nausea or vomiting. 08/02/14   Shon Baton, MD   BP 112/63 mmHg  Pulse 70  Temp(Src) 98 F (36.7 C) (Oral)  Resp 12  SpO2 99% Physical Exam  Constitutional: She is oriented to person, place, and time. No distress.  Ill-appearing but nontoxic  HENT:  Head: Normocephalic and atraumatic.  Eyes: Pupils are  equal, round, and reactive to light.  Cardiovascular: Normal rate, regular rhythm and normal heart sounds.   No murmur heard. Pulmonary/Chest: Effort normal and breath sounds normal. No respiratory distress. She has no wheezes.  Abdominal: Soft. Bowel sounds are normal. There is no tenderness. There is no rebound and no guarding.  Musculoskeletal:  No CVA tenderness, no lower lumbar or thoracic tenderness noted  Neurological: She is alert and oriented to person, place, and time.  Skin: Skin is warm and dry.   Psychiatric: She has a normal mood and affect.  Nursing note and vitals reviewed.   ED Course  Procedures (including critical care time) Labs Review Labs Reviewed  COMPREHENSIVE METABOLIC PANEL - Abnormal; Notable for the following:    Glucose, Bld 117 (*)    All other components within normal limits  URINALYSIS, ROUTINE W REFLEX MICROSCOPIC - Abnormal; Notable for the following:    APPearance CLOUDY (*)    Hgb urine dipstick TRACE (*)    Nitrite POSITIVE (*)    Leukocytes, UA LARGE (*)    All other components within normal limits  URINE MICROSCOPIC-ADD ON - Abnormal; Notable for the following:    Bacteria, UA MANY (*)    All other components within normal limits  URINE CULTURE  CBC  TROPONIN I  LIPASE, BLOOD  D-DIMER, QUANTITATIVE  I-STAT TROPOININ, ED    Imaging Review Dg Chest 2 View  08/02/2014   CLINICAL DATA:  Upper back pain  EXAM: CHEST  2 VIEW  COMPARISON:  02/11/2012  FINDINGS: The heart size and mediastinal contours are within normal limits. Both lungs are clear. The visualized skeletal structures are unremarkable.  IMPRESSION: No active cardiopulmonary disease.   Electronically Signed   By: Ellery Plunkaniel R Mitchell M.D.   On: 08/02/2014 04:28     EKG Interpretation  Normal sinus rhythm with a rate of 78, no acute ST elevation or ischemia noted      MDM   Final diagnoses:  Pyelonephritis    Patient presents with back pain, nausea, and vomiting. Retching on my evaluation. No significant CVA tenderness. Lab work obtained. Patient given pain and nausea medication.  9:44 AM Patient reports improvement of nausea but has vomited several additional times.. However, now complaining of headache. History of migraines. Headache is behind the right eye and consistent with prior migraines. Patient given headache cocktail. Initial labwork reassuring. Patient to provide urine.  10:40 AM Urine nitrite positive. Suspect patient has early pyelonephritis. No fever or  leukocytosis but given vomiting and back pain, will treat for upper urinary tract infection. Patient given IV Rocephin.  12:32 PM On recheck, patient reports improvement. She is resting comfortably. She is able to tolerate fluids. Will discharge on Cipro twice a day for 7 days. Patient to follow-up with her physician. Patient given return precautions.  After history, exam, and medical workup I feel the patient has been appropriately medically screened and is safe for discharge home. Pertinent diagnoses were discussed with the patient. Patient was given return precautions.   Shon Batonourtney F Moriya Mitchell, MD 08/02/14 [phone]

## 2014-08-04 LAB — URINE CULTURE: Colony Count: >=100000

## 2014-12-05 ENCOUNTER — Other Ambulatory Visit: Payer: Self-pay

## 2014-12-05 DIAGNOSIS — Z1231 Encounter for screening mammogram for malignant neoplasm of breast: Secondary | ICD-10-CM

## 2014-12-06 ENCOUNTER — Other Ambulatory Visit: Payer: Self-pay | Admitting: Family Medicine

## 2014-12-06 DIAGNOSIS — N951 Menopausal and female climacteric states: Secondary | ICD-10-CM

## 2015-01-06 ENCOUNTER — Ambulatory Visit
Admission: RE | Admit: 2015-01-06 | Discharge: 2015-01-06 | Disposition: A | Discharge status: Home / Self Care | Payer: BLUE CROSS/BLUE SHIELD | Source: Ambulatory Visit

## 2015-01-06 ENCOUNTER — Ambulatory Visit
Admission: RE | Admit: 2015-01-06 | Discharge: 2015-01-06 | Disposition: A | Discharge status: Home / Self Care | Payer: BLUE CROSS/BLUE SHIELD | Source: Ambulatory Visit | Attending: Family Medicine | Admitting: Family Medicine

## 2015-01-06 DIAGNOSIS — N951 Menopausal and female climacteric states: Secondary | ICD-10-CM

## 2015-01-06 DIAGNOSIS — Z1231 Encounter for screening mammogram for malignant neoplasm of breast: Secondary | ICD-10-CM

## 2015-11-29 ENCOUNTER — Other Ambulatory Visit: Payer: Self-pay | Admitting: Family Medicine

## 2015-11-29 DIAGNOSIS — Z79899 Other long term (current) drug therapy: Secondary | ICD-10-CM | POA: Diagnosis not present

## 2015-11-29 DIAGNOSIS — Z1231 Encounter for screening mammogram for malignant neoplasm of breast: Secondary | ICD-10-CM

## 2015-11-29 DIAGNOSIS — M810 Age-related osteoporosis without current pathological fracture: Secondary | ICD-10-CM | POA: Diagnosis not present

## 2015-11-29 DIAGNOSIS — E538 Deficiency of other specified B group vitamins: Secondary | ICD-10-CM | POA: Diagnosis not present

## 2015-11-29 DIAGNOSIS — E7800 Pure hypercholesterolemia, unspecified: Secondary | ICD-10-CM | POA: Diagnosis not present

## 2015-12-14 ENCOUNTER — Emergency Department (HOSPITAL_BASED_OUTPATIENT_CLINIC_OR_DEPARTMENT_OTHER): Payer: No Typology Code available for payment source

## 2015-12-14 ENCOUNTER — Encounter (HOSPITAL_BASED_OUTPATIENT_CLINIC_OR_DEPARTMENT_OTHER): Payer: Self-pay | Admitting: *Deleted

## 2015-12-14 ENCOUNTER — Emergency Department (HOSPITAL_BASED_OUTPATIENT_CLINIC_OR_DEPARTMENT_OTHER)
Admission: EM | Admit: 2015-12-14 | Discharge: 2015-12-14 | Disposition: A | Discharge status: Home / Self Care | Payer: No Typology Code available for payment source | Attending: Emergency Medicine | Admitting: Emergency Medicine

## 2015-12-14 DIAGNOSIS — M542 Cervicalgia: Secondary | ICD-10-CM | POA: Insufficient documentation

## 2015-12-14 DIAGNOSIS — Y999 Unspecified external cause status: Secondary | ICD-10-CM | POA: Diagnosis not present

## 2015-12-14 DIAGNOSIS — Y9389 Activity, other specified: Secondary | ICD-10-CM | POA: Insufficient documentation

## 2015-12-14 DIAGNOSIS — Y92410 Unspecified street and highway as the place of occurrence of the external cause: Secondary | ICD-10-CM | POA: Insufficient documentation

## 2015-12-14 DIAGNOSIS — Z23 Encounter for immunization: Secondary | ICD-10-CM | POA: Diagnosis not present

## 2015-12-14 DIAGNOSIS — M25511 Pain in right shoulder: Secondary | ICD-10-CM | POA: Diagnosis not present

## 2015-12-14 DIAGNOSIS — M79601 Pain in right arm: Secondary | ICD-10-CM | POA: Diagnosis not present

## 2015-12-14 DIAGNOSIS — S50811A Abrasion of right forearm, initial encounter: Secondary | ICD-10-CM | POA: Insufficient documentation

## 2015-12-14 DIAGNOSIS — S59911A Unspecified injury of right forearm, initial encounter: Secondary | ICD-10-CM | POA: Diagnosis present

## 2015-12-14 DIAGNOSIS — T148 Other injury of unspecified body region: Secondary | ICD-10-CM | POA: Diagnosis not present

## 2015-12-14 DIAGNOSIS — Z79899 Other long term (current) drug therapy: Secondary | ICD-10-CM | POA: Insufficient documentation

## 2015-12-14 HISTORY — DX: Pure hypercholesterolemia, unspecified: E78.00

## 2015-12-14 MED ORDER — ACETAMINOPHEN 500 MG PO TABS
1000.0000 mg | ORAL_TABLET | Freq: Once | ORAL | Status: AC
  Administered 2015-12-14: 1000 mg via ORAL
  Filled 2015-12-14: qty 2

## 2015-12-14 MED ORDER — TETANUS-DIPHTH-ACELL PERTUSSIS 5-2.5-18.5 LF-MCG/0.5 IM SUSP
0.5000 mL | Freq: Once | INTRAMUSCULAR | Status: AC
  Administered 2015-12-14: 0.5 mL via INTRAMUSCULAR
  Filled 2015-12-14: qty 0.5

## 2015-12-14 MED ORDER — IBUPROFEN 800 MG PO TABS
800.0000 mg | ORAL_TABLET | Freq: Once | ORAL | Status: AC
  Administered 2015-12-14: 800 mg via ORAL
  Filled 2015-12-14: qty 1

## 2015-12-14 NOTE — ED Notes (Signed)
CMS intact before and after. Pt tolerated well. Pt had no questions.  
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
## 2015-12-14 NOTE — Discharge Instructions (Signed)
Take 4 over the counter ibuprofen tablets 3 times a day or 2 over-the-counter naproxen tablets twice a day for pain. Also take tylenol 1000mg(2 extra strength) four times a day.    

## 2015-12-14 NOTE — ED Notes (Signed)
Patient transported to X-ray 
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
## 2015-12-15 ENCOUNTER — Encounter (HOSPITAL_COMMUNITY): Payer: Self-pay | Admitting: Emergency Medicine

## 2016-01-10 ENCOUNTER — Ambulatory Visit
Admission: RE | Admit: 2016-01-10 | Discharge: 2016-01-10 | Disposition: A | Discharge status: Home / Self Care | Payer: BLUE CROSS/BLUE SHIELD | Source: Ambulatory Visit | Attending: Family Medicine | Admitting: Family Medicine

## 2016-01-10 ENCOUNTER — Ambulatory Visit: Payer: BLUE CROSS/BLUE SHIELD

## 2016-01-10 DIAGNOSIS — Z1231 Encounter for screening mammogram for malignant neoplasm of breast: Secondary | ICD-10-CM

## 2016-01-10 DIAGNOSIS — Z01419 Encounter for gynecological examination (general) (routine) without abnormal findings: Secondary | ICD-10-CM | POA: Diagnosis not present

## 2016-01-10 DIAGNOSIS — Z6824 Body mass index (BMI) 24.0-24.9, adult: Secondary | ICD-10-CM | POA: Diagnosis not present

## 2016-02-12 DIAGNOSIS — Z1211 Encounter for screening for malignant neoplasm of colon: Secondary | ICD-10-CM | POA: Diagnosis not present

## 2016-06-17 DIAGNOSIS — J019 Acute sinusitis, unspecified: Secondary | ICD-10-CM | POA: Diagnosis not present

## 2016-06-17 DIAGNOSIS — Z6823 Body mass index (BMI) 23.0-23.9, adult: Secondary | ICD-10-CM | POA: Diagnosis not present

## 2017-01-07 ENCOUNTER — Other Ambulatory Visit: Payer: Self-pay | Admitting: Physician Assistant

## 2017-01-07 DIAGNOSIS — E2839 Other primary ovarian failure: Secondary | ICD-10-CM

## 2017-01-07 DIAGNOSIS — Z1231 Encounter for screening mammogram for malignant neoplasm of breast: Secondary | ICD-10-CM

## 2017-01-31 ENCOUNTER — Ambulatory Visit
Admission: RE | Admit: 2017-01-31 | Discharge: 2017-01-31 | Disposition: A | Discharge status: Home / Self Care | Payer: BLUE CROSS/BLUE SHIELD | Source: Ambulatory Visit | Attending: Physician Assistant | Admitting: Physician Assistant

## 2017-01-31 DIAGNOSIS — E2839 Other primary ovarian failure: Secondary | ICD-10-CM

## 2017-01-31 DIAGNOSIS — Z1231 Encounter for screening mammogram for malignant neoplasm of breast: Secondary | ICD-10-CM | POA: Diagnosis not present

## 2017-01-31 DIAGNOSIS — Z780 Asymptomatic menopausal state: Secondary | ICD-10-CM | POA: Diagnosis not present

## 2017-01-31 DIAGNOSIS — M8589 Other specified disorders of bone density and structure, multiple sites: Secondary | ICD-10-CM | POA: Diagnosis not present

## 2017-02-11 DIAGNOSIS — Z1389 Encounter for screening for other disorder: Secondary | ICD-10-CM | POA: Diagnosis not present

## 2017-02-11 DIAGNOSIS — Z79899 Other long term (current) drug therapy: Secondary | ICD-10-CM | POA: Diagnosis not present

## 2017-02-11 DIAGNOSIS — E7800 Pure hypercholesterolemia, unspecified: Secondary | ICD-10-CM | POA: Diagnosis not present

## 2017-02-11 DIAGNOSIS — M810 Age-related osteoporosis without current pathological fracture: Secondary | ICD-10-CM | POA: Diagnosis not present

## 2017-02-11 DIAGNOSIS — E538 Deficiency of other specified B group vitamins: Secondary | ICD-10-CM | POA: Diagnosis not present

## 2017-04-18 DIAGNOSIS — Z6824 Body mass index (BMI) 24.0-24.9, adult: Secondary | ICD-10-CM | POA: Diagnosis not present

## 2017-04-18 DIAGNOSIS — J029 Acute pharyngitis, unspecified: Secondary | ICD-10-CM | POA: Diagnosis not present

## 2017-04-24 IMAGING — MG MM DIGITAL SCREENING BILAT W/ TOMO W/ CAD
9 of 13 series · 9 of 29 positions shown · non-contrast
Comparison: Previous exam(s).

CLINICAL DATA: Screening.

EXAM:
2D DIGITAL SCREENING BILATERAL MAMMOGRAM WITH CAD AND ADJUNCT TOMO

[L CC (1 of 2)]
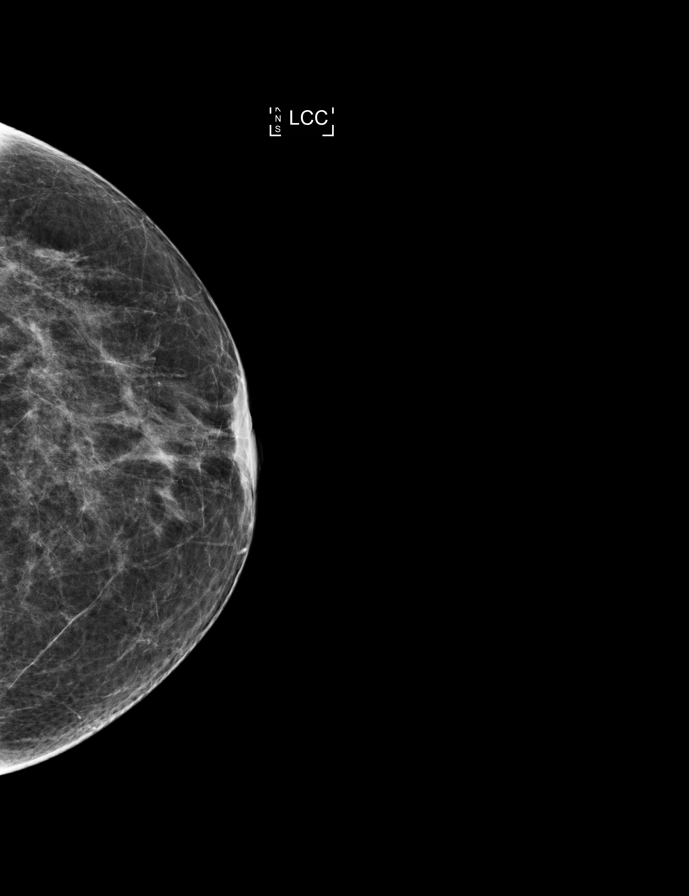

[R MLO synth-2D]
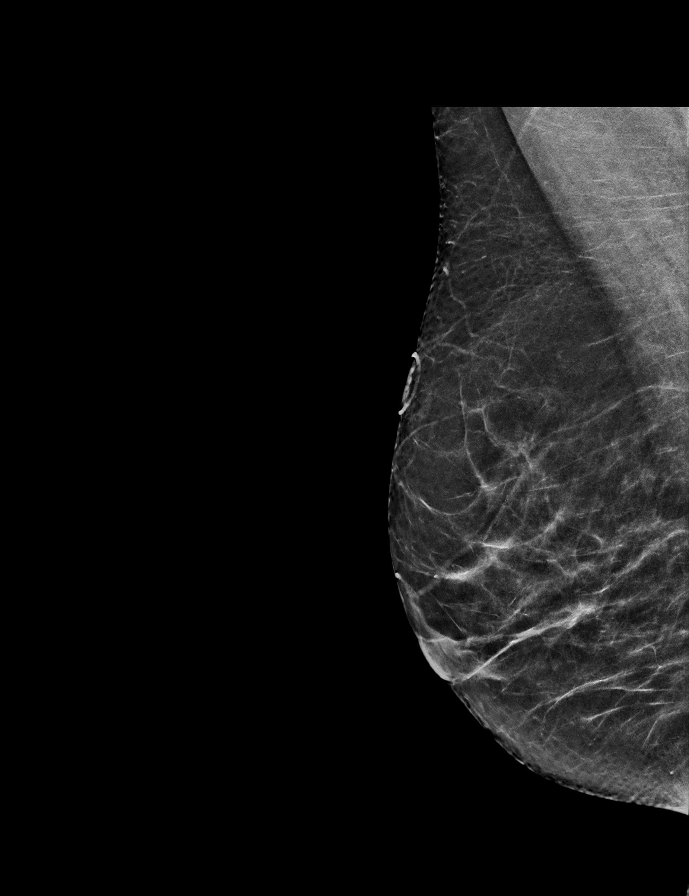

[R CC]
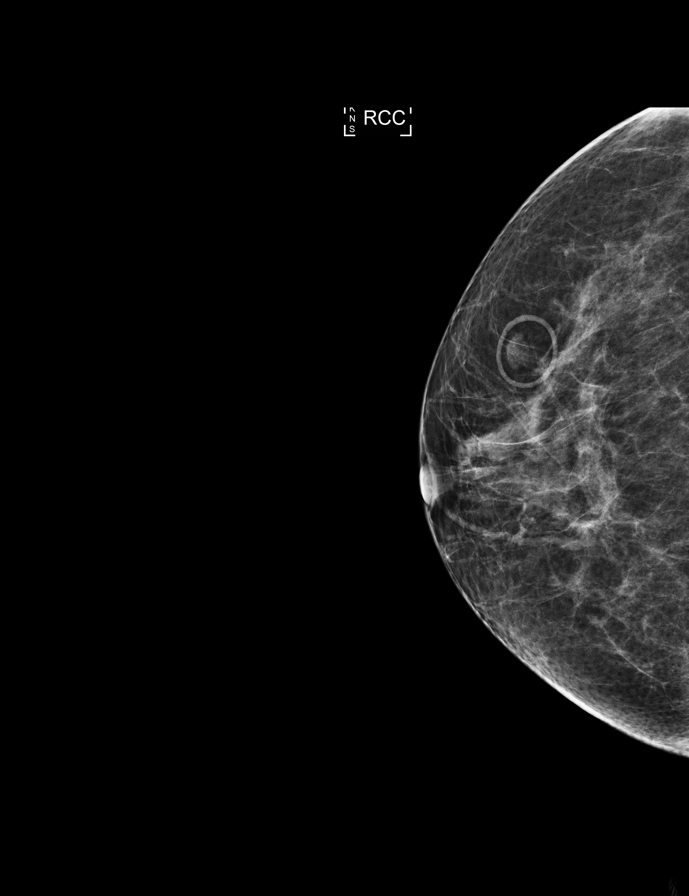

[R CC synth-2D]
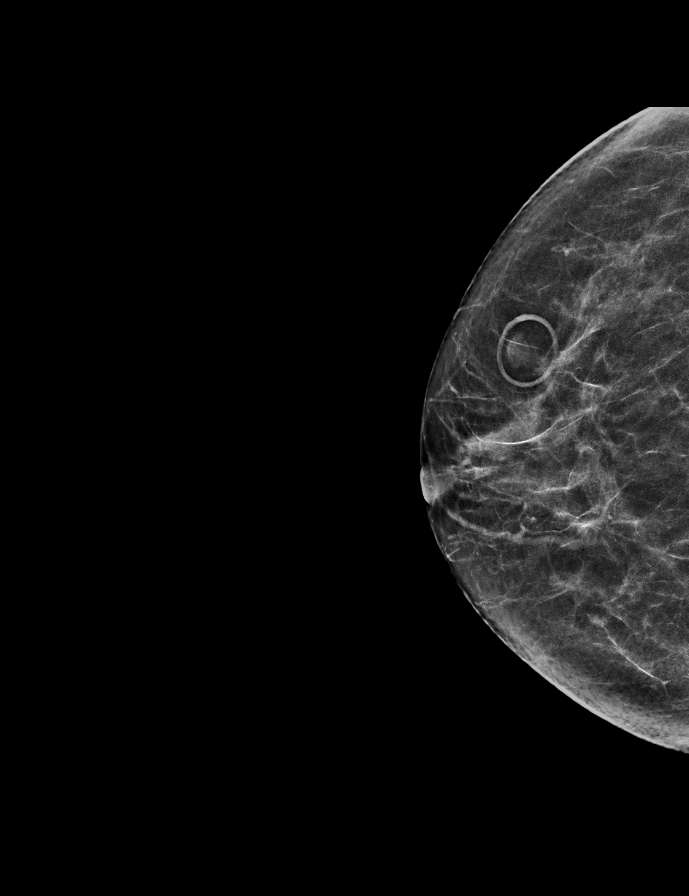

[L CC (2 of 2)]
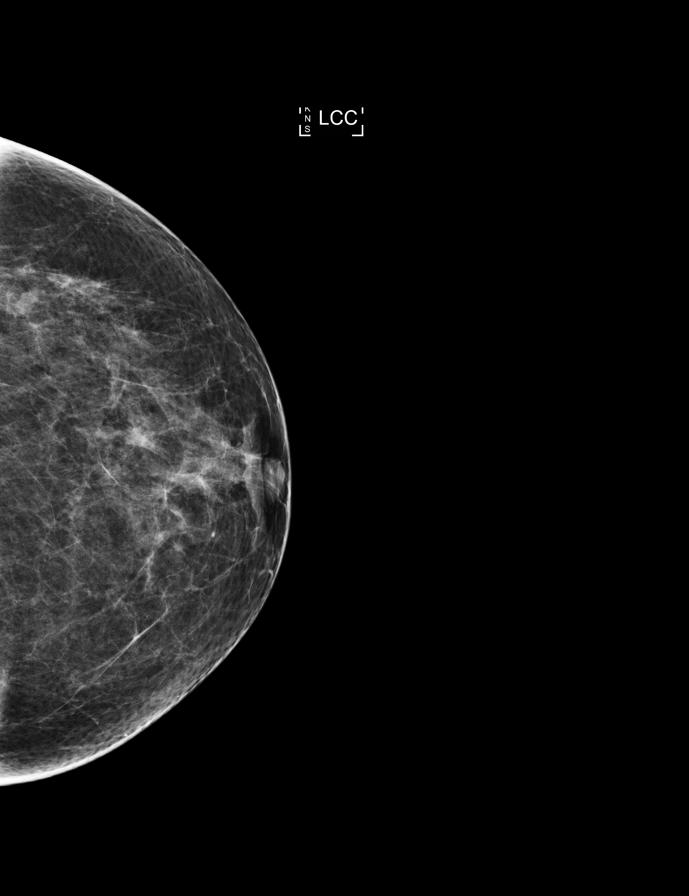

[R MLO]
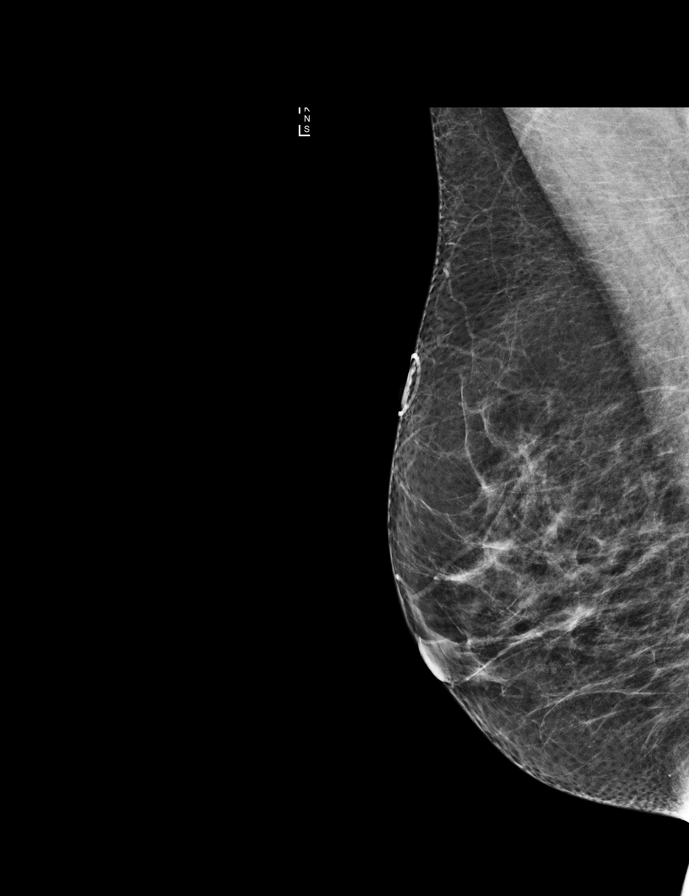

[L CC synth-2D]
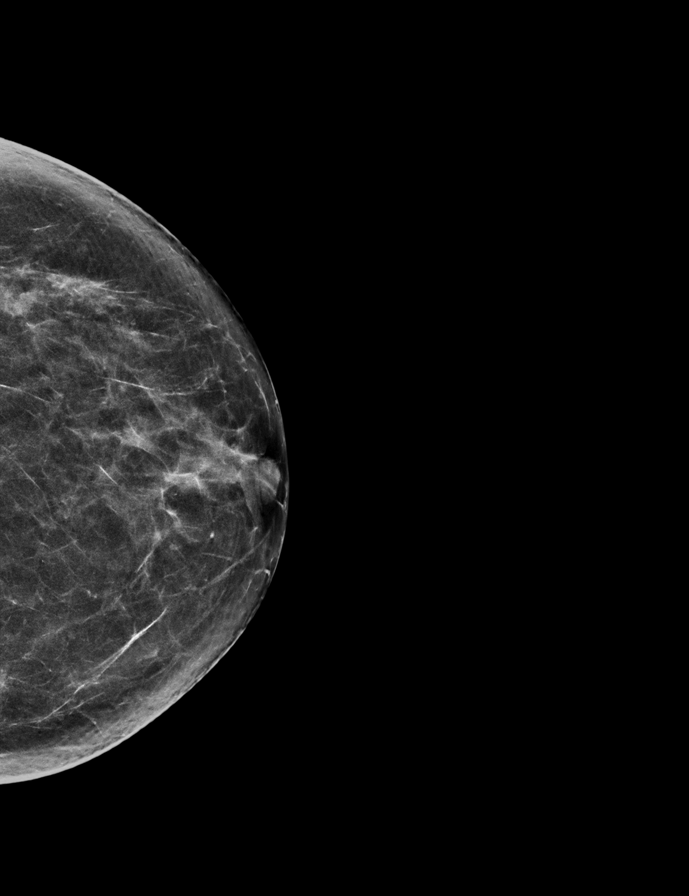

[L MLO synth-2D]
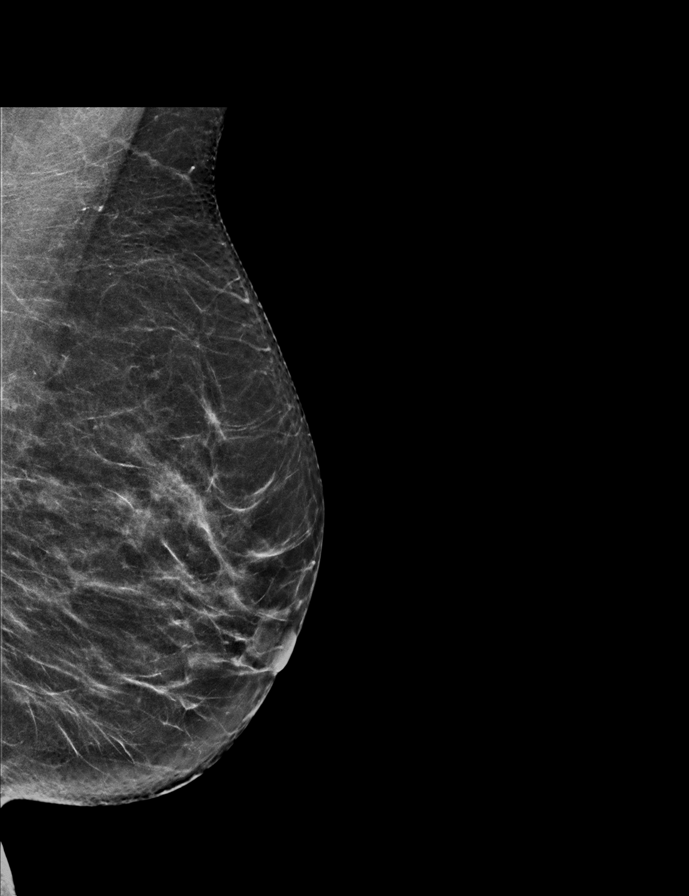

[L MLO]
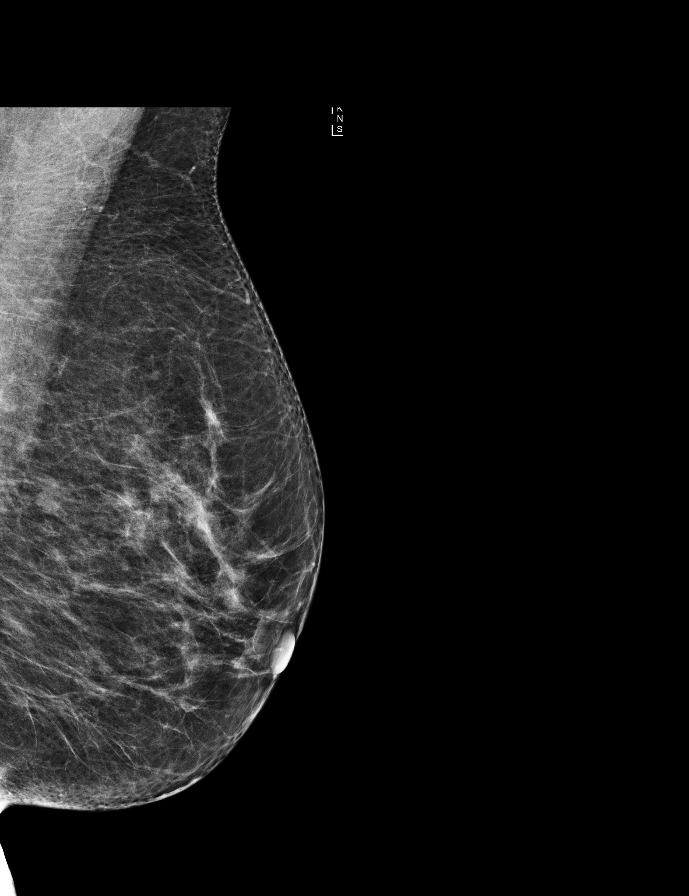

[9 of 29 positions shown; findings below may reference images not displayed]

ACR Breast Density Category b: There are scattered areas of
fibroglandular density.
FINDINGS: There are no findings suspicious for malignancy. Images were
processed with CAD.
IMPRESSION: No mammographic evidence of malignancy. A result letter of this
screening mammogram will be mailed directly to the patient.

RECOMMENDATION:
Screening mammogram in one year. (Code:97-6-RS4)

BI-RADS CATEGORY  1: Negative.

## 2017-08-12 DIAGNOSIS — E7800 Pure hypercholesterolemia, unspecified: Secondary | ICD-10-CM | POA: Diagnosis not present

## 2017-08-12 DIAGNOSIS — Z79899 Other long term (current) drug therapy: Secondary | ICD-10-CM | POA: Diagnosis not present

## 2017-08-12 DIAGNOSIS — M810 Age-related osteoporosis without current pathological fracture: Secondary | ICD-10-CM | POA: Diagnosis not present

## 2017-08-12 DIAGNOSIS — Z1331 Encounter for screening for depression: Secondary | ICD-10-CM | POA: Diagnosis not present

## 2017-08-12 DIAGNOSIS — E538 Deficiency of other specified B group vitamins: Secondary | ICD-10-CM | POA: Diagnosis not present

## 2017-08-24 DIAGNOSIS — S39012A Strain of muscle, fascia and tendon of lower back, initial encounter: Secondary | ICD-10-CM | POA: Diagnosis not present

## 2017-08-24 DIAGNOSIS — M545 Low back pain: Secondary | ICD-10-CM | POA: Diagnosis not present

## 2017-10-15 DIAGNOSIS — N390 Urinary tract infection, site not specified: Secondary | ICD-10-CM | POA: Diagnosis not present

## 2017-10-15 DIAGNOSIS — R300 Dysuria: Secondary | ICD-10-CM | POA: Diagnosis not present

## 2017-10-25 ENCOUNTER — Other Ambulatory Visit: Payer: Self-pay

## 2017-10-25 ENCOUNTER — Encounter (HOSPITAL_COMMUNITY): Payer: Self-pay | Admitting: Emergency Medicine

## 2017-10-25 ENCOUNTER — Emergency Department (HOSPITAL_COMMUNITY)
Admission: EM | Admit: 2017-10-25 | Discharge: 2017-10-25 | Disposition: A | Discharge status: Home / Self Care | Payer: BLUE CROSS/BLUE SHIELD | Attending: Emergency Medicine | Admitting: Emergency Medicine

## 2017-10-25 DIAGNOSIS — Z79899 Other long term (current) drug therapy: Secondary | ICD-10-CM | POA: Insufficient documentation

## 2017-10-25 DIAGNOSIS — R300 Dysuria: Secondary | ICD-10-CM | POA: Diagnosis not present

## 2017-10-25 DIAGNOSIS — Z7982 Long term (current) use of aspirin: Secondary | ICD-10-CM | POA: Diagnosis not present

## 2017-10-25 DIAGNOSIS — M545 Low back pain: Secondary | ICD-10-CM | POA: Diagnosis not present

## 2017-10-25 DIAGNOSIS — E7800 Pure hypercholesterolemia, unspecified: Secondary | ICD-10-CM | POA: Insufficient documentation

## 2017-10-25 DIAGNOSIS — N3000 Acute cystitis without hematuria: Secondary | ICD-10-CM | POA: Insufficient documentation

## 2017-10-25 HISTORY — DX: Tubulo-interstitial nephritis, not specified as acute or chronic: N12

## 2017-10-25 LAB — CBC WITH DIFFERENTIAL/PLATELET
ABS IMMATURE GRANULOCYTES: 0 10*3/uL (ref 0.0–0.1)
BASOS ABS: 0 10*3/uL (ref 0.0–0.1)
Basophils Relative: 0 %
EOS PCT: 1 %
Eosinophils Absolute: 0.1 10*3/uL (ref 0.0–0.7)
HCT: 39.7 % (ref 36.0–46.0)
Hemoglobin: 12.8 g/dL (ref 12.0–15.0)
Immature Granulocytes: 0 %
Lymphocytes Relative: 23 %
Lymphs Abs: 1.7 10*3/uL (ref 0.7–4.0)
MCH: 27.6 pg (ref 26.0–34.0)
MCHC: 32.2 g/dL (ref 30.0–36.0)
MCV: 85.7 fL (ref 78.0–100.0)
MONOS PCT: 5 %
Monocytes Absolute: 0.4 10*3/uL (ref 0.1–1.0)
Neutro Abs: 5 10*3/uL (ref 1.7–7.7)
Neutrophils Relative %: 71 %
Platelets: 309 10*3/uL (ref 150–400)
RBC: 4.63 MIL/uL (ref 3.87–5.11)
RDW: 12.5 % (ref 11.5–15.5)
WBC: 7.2 10*3/uL (ref 4.0–10.5)

## 2017-10-25 LAB — URINALYSIS, ROUTINE W REFLEX MICROSCOPIC
BILIRUBIN URINE: NEGATIVE
Bacteria, UA: NONE SEEN
GLUCOSE, UA: NEGATIVE mg/dL
KETONES UR: NEGATIVE mg/dL
NITRITE: NEGATIVE
PH: 7 (ref 5.0–8.0)
PROTEIN: 100 mg/dL — AB
Specific Gravity, Urine: 1.002 — ABNORMAL LOW (ref 1.005–1.030)
WBC, UA: >50 WBC/hpf — ABNORMAL HIGH (ref 0–5)

## 2017-10-25 LAB — BASIC METABOLIC PANEL
Anion gap: 11 (ref 5–15)
BUN: 16 mg/dL (ref 6–20)
CO2: 26 mmol/L (ref 22–32)
Calcium: 9.5 mg/dL (ref 8.9–10.3)
Chloride: 103 mmol/L (ref 101–111)
Creatinine, Ser: 0.9 mg/dL (ref 0.44–1.00)
GFR calc Af Amer: >60 mL/min (ref >60)
GFR calc non Af Amer: >60 mL/min (ref >60)
GLUCOSE: 117 mg/dL — AB (ref 65–99)
POTASSIUM: 4 mmol/L (ref 3.5–5.1)
SODIUM: 140 mmol/L (ref 135–145)

## 2017-10-25 MED ORDER — PHENAZOPYRIDINE HCL 200 MG PO TABS: 200.0000 mg | ORAL_TABLET | Freq: Three times a day (TID) | ORAL | 0 refills | Status: AC | PRN

## 2017-10-25 MED ORDER — SULFAMETHOXAZOLE-TRIMETHOPRIM 800-160 MG PO TABS: 1.0000 | ORAL_TABLET | Freq: Two times a day (BID) | ORAL | 0 refills | Status: AC

## 2017-10-25 NOTE — ED Triage Notes (Signed)
Patient reports recurring dysuria, bladder pressure and low back pain this evening , pt. stated she just completed her oral antibiotic for UTI this week with no improvement . Denies fever .

## 2017-10-25 NOTE — Discharge Instructions (Addendum)
Urine has been sent for culture.  We will call you if the antibiotic needs to be changed.  See your primary care physician if you are not improving in a week.  Return for fever, vomiting or if concern for any reason

## 2017-10-25 NOTE — ED Provider Notes (Signed)
[facility] EMERGENCY DEPARTMENT Provider Note   CSN: [id number] Arrival date & time: 10/25/17  0229     History   Chief Complaint Chief Complaint  Patient presents with  . UTI    Dysuria    HPI Cheyenne Hart is a 57 y.o. female.  HPI Complains of burning on urination urethral meatus and mild bilateral lower back pain onset 10/14/2017.  She was seen by an urgent care center on 10/15/2017 prescribed Macrobid and another medication to help with dysuria which she took and she felt improved until 12:30 AM today.  When dysuria and low back pain returned.  No fever.  No vomiting.  No other associated symptoms.  Symptoms worse with urination.  Not improved by anything. Past Medical History:  Diagnosis Date  . Fibromyalgia   . High cholesterol   . Pyelonephritis     Patient Active Problem List   Diagnosis Date Noted  . Aphasia 02/11/2012  . Observed seizure-like activity (HCC) 02/11/2012  . Altered mental state 02/11/2012  . Elevated BP 02/11/2012    Past Surgical History:  Procedure Laterality Date  . ABDOMINAL HYSTERECTOMY       OB History   None      Home Medications    Prior to Admission medications   Medication Sig Start Date End Date Taking? Authorizing Provider  alendronate (FOSAMAX) 70 MG tablet Take 70 mg by mouth once a week. Take with a full glass of water on an empty stomach.    [provider]  aspirin (ASPIRIN EC) 81 MG EC tablet Take 1 tablet (81 mg total) by mouth daily. Swallow whole. 02/12/12   Laza, Sorin C, MD  cefUROXime (CEFTIN) 500 MG tablet Take 500 mg by mouth 2 (two) times daily.  08/01/14   [provider]  cetirizine (ZYRTEC) 10 MG tablet Take 10 mg by mouth daily.    [provider]  CHERATUSSIN AC 100-10 MG/5ML syrup Take 5 mLs by mouth 3 (three) times daily as needed for cough.  08/01/14   [provider]  ciprofloxacin (CIPRO) 500 MG tablet Take 1 tablet (500 mg total) by mouth 2 (two)  times daily. 08/02/14   Horton, Mayer Masker, MD  HYDROcodone-acetaminophen (NORCO/VICODIN) 5-325 MG per tablet Take 1 tablet by mouth every 6 (six) hours as needed. 08/02/14   Horton, Mayer Masker, MD  ondansetron (ZOFRAN ODT) 4 MG disintegrating tablet Take 1 tablet (4 mg total) by mouth every 8 (eight) hours as needed for nausea or vomiting. 08/02/14   Horton, Mayer Masker, MD  simvastatin (ZOCOR) 10 MG tablet Take 1 tablet (10 mg total) by mouth at bedtime. 02/12/12   Lorane Gell, MD  SIMVASTATIN PO Take by mouth.    [provider]    Family History Family History  Problem Relation Age of Onset  . Breast cancer Sister   . Breast cancer Maternal Aunt     Social History Social History   Tobacco Use  . Smoking status: Never Smoker  . Smokeless tobacco: Never Used  Substance Use Topics  . Alcohol use: No  . Drug use: No     Allergies   Amoxicillin and Amoxicillin   Review of Systems Review of Systems  Constitutional: Negative.   HENT: Negative.   Respiratory: Negative.   Cardiovascular: Negative.   Gastrointestinal: Negative.   Genitourinary: Positive for dysuria.  Musculoskeletal: Positive for back pain.  Skin: Negative.   Neurological: Negative.   Psychiatric/Behavioral: Negative.  All other systems reviewed and are negative.    Physical Exam Updated Vital Signs BP 120/75   Pulse 75   Temp 98 F (36.7 C) (Oral)   Resp 14   SpO2 99%   Physical Exam  Constitutional: She appears well-developed and well-nourished.  HENT:  Head: Normocephalic and atraumatic.  Eyes: Pupils are equal, round, and reactive to light. Conjunctivae are normal.  Neck: Neck supple. No tracheal deviation present. No thyromegaly present.  Cardiovascular: Normal rate and regular rhythm.  No murmur heard. Pulmonary/Chest: Effort normal and breath sounds normal.  Abdominal: Soft. Bowel sounds are normal. She exhibits no distension. There is no tenderness.  Genitourinary:    Genitourinary Comments: No flank tenderness  Musculoskeletal: Normal range of motion. She exhibits no edema or tenderness.  Neurological: She is alert. Coordination normal.  Skin: Skin is warm and dry. No rash noted.  Psychiatric: She has a normal mood and affect.  Nursing note and vitals reviewed.    ED Treatments / Results  Labs (all labs ordered are listed, but only abnormal results are displayed) Labs Reviewed  URINALYSIS, ROUTINE W REFLEX MICROSCOPIC - Abnormal; Notable for the following components:      Result Value   APPearance HAZY (*)    Specific Gravity, Urine 1.002 (*)    Hgb urine dipstick LARGE (*)    Protein, ur 100 (*)    Leukocytes, UA LARGE (*)    WBC, UA >50 (*)    All other components within normal limits  BASIC METABOLIC PANEL - Abnormal; Notable for the following components:   Glucose, Bld 117 (*)    All other components within normal limits  URINE CULTURE  CBC WITH DIFFERENTIAL/PLATELET   Results for orders placed or performed during the hospital encounter of 10/25/17  Urinalysis, Routine w reflex microscopic  Result Value Ref Range   Color, Urine YELLOW YELLOW   APPearance HAZY (A) CLEAR   Specific Gravity, Urine 1.002 (L) 1.005 - 1.030   pH 7.0 5.0 - 8.0   Glucose, UA NEGATIVE NEGATIVE mg/dL   Hgb urine dipstick LARGE (A) NEGATIVE   Bilirubin Urine NEGATIVE NEGATIVE   Ketones, ur NEGATIVE NEGATIVE mg/dL   Protein, ur 161 (A) NEGATIVE mg/dL   Nitrite NEGATIVE NEGATIVE   Leukocytes, UA LARGE (A) NEGATIVE   RBC / HPF 0-5 0 - 5 RBC/hpf   WBC, UA >50 (H) 0 - 5 WBC/hpf   Bacteria, UA NONE SEEN NONE SEEN   WBC Clumps PRESENT   CBC with Differential  Result Value Ref Range   WBC 7.2 4.0 - 10.5 K/uL   RBC 4.63 3.87 - 5.11 MIL/uL   Hemoglobin 12.8 12.0 - 15.0 g/dL   HCT 09.6 04.5 - 40.9 %   MCV 85.7 78.0 - 100.0 fL   MCH 27.6 26.0 - 34.0 pg   MCHC 32.2 30.0 - 36.0 g/dL   RDW 81.1 91.4 - 78.2 %   Platelets 309 150 - 400 K/uL   Neutrophils  Relative % 71 %   Neutro Abs 5.0 1.7 - 7.7 K/uL   Lymphocytes Relative 23 %   Lymphs Abs 1.7 0.7 - 4.0 K/uL   Monocytes Relative 5 %   Monocytes Absolute 0.4 0.1 - 1.0 K/uL   Eosinophils Relative 1 %   Eosinophils Absolute 0.1 0.0 - 0.7 K/uL   Basophils Relative 0 %   Basophils Absolute 0.0 0.0 - 0.1 K/uL   Immature Granulocytes 0 %   Abs Immature Granulocytes 0.0 0.0 -  0.1 K/uL  Basic metabolic panel  Result Value Ref Range   Sodium 140 135 - 145 mmol/L   Potassium 4.0 3.5 - 5.1 mmol/L   Chloride 103 101 - 111 mmol/L   CO2 26 22 - 32 mmol/L   Glucose, Bld 117 (H) 65 - 99 mg/dL   BUN 16 6 - 20 mg/dL   Creatinine, Ser 1.610.90 0.44 - 1.00 mg/dL   Calcium 9.5 8.9 - 09.610.3 mg/dL   GFR calc non Af Amer >60 >60 mL/min   GFR calc Af Amer >60 >60 mL/min   Anion gap 11 5 - 15   No results found. EKG None  Radiology No results found.  Procedures Procedures (including critical care time)  Medications Ordered in ED Medications - No data to display  Results for orders placed or performed during the hospital encounter of 10/25/17  Urinalysis, Routine w reflex microscopic  Result Value Ref Range   Color, Urine YELLOW YELLOW   APPearance HAZY (A) CLEAR   Specific Gravity, Urine 1.002 (L) 1.005 - 1.030   pH 7.0 5.0 - 8.0   Glucose, UA NEGATIVE NEGATIVE mg/dL   Hgb urine dipstick LARGE (A) NEGATIVE   Bilirubin Urine NEGATIVE NEGATIVE   Ketones, ur NEGATIVE NEGATIVE mg/dL   Protein, ur 045100 (A) NEGATIVE mg/dL   Nitrite NEGATIVE NEGATIVE   Leukocytes, UA LARGE (A) NEGATIVE   RBC / HPF 0-5 0 - 5 RBC/hpf   WBC, UA >50 (H) 0 - 5 WBC/hpf   Bacteria, UA NONE SEEN NONE SEEN   WBC Clumps PRESENT   CBC with Differential  Result Value Ref Range   WBC 7.2 4.0 - 10.5 K/uL   RBC 4.63 3.87 - 5.11 MIL/uL   Hemoglobin 12.8 12.0 - 15.0 g/dL   HCT 40.939.7 81.136.0 - 91.446.0 %   MCV 85.7 78.0 - 100.0 fL   MCH 27.6 26.0 - 34.0 pg   MCHC 32.2 30.0 - 36.0 g/dL   RDW 78.212.5 95.611.5 - 21.315.5 %   Platelets 309 150 -  400 K/uL   Neutrophils Relative % 71 %   Neutro Abs 5.0 1.7 - 7.7 K/uL   Lymphocytes Relative 23 %   Lymphs Abs 1.7 0.7 - 4.0 K/uL   Monocytes Relative 5 %   Monocytes Absolute 0.4 0.1 - 1.0 K/uL   Eosinophils Relative 1 %   Eosinophils Absolute 0.1 0.0 - 0.7 K/uL   Basophils Relative 0 %   Basophils Absolute 0.0 0.0 - 0.1 K/uL   Immature Granulocytes 0 %   Abs Immature Granulocytes 0.0 0.0 - 0.1 K/uL  Basic metabolic panel  Result Value Ref Range   Sodium 140 135 - 145 mmol/L   Potassium 4.0 3.5 - 5.1 mmol/L   Chloride 103 101 - 111 mmol/L   CO2 26 22 - 32 mmol/L   Glucose, Bld 117 (H) 65 - 99 mg/dL   BUN 16 6 - 20 mg/dL   Creatinine, Ser 0.860.90 0.44 - 1.00 mg/dL   Calcium 9.5 8.9 - 57.810.3 mg/dL   GFR calc non Af Amer >60 >60 mL/min   GFR calc Af Amer >60 >60 mL/min   Anion gap 11 5 - 15   No results found. Initial Impression / Assessment and Plan / ED Course  I have reviewed the triage vital signs and the nursing notes.  Pertinent labs & imaging results that were available during my care of the patient were reviewed by me and considered in my medical decision making (see  chart for details).     Plan prescriptions Pyridium, Bactrim DS.  Follow-up with PMD if not better in a week.  Urine sent for culture Lab work consistent with urinary tract infection  Final Clinical Impressions(s) / ED Diagnoses  acute cystitis Final diagnoses:  None    ED Discharge Orders    None       Doug Sou, MD 10/25/17 [phone]

## 2017-10-27 LAB — URINE CULTURE
Culture: 80000 — AB
SPECIAL REQUESTS: NORMAL

## 2017-10-28 ENCOUNTER — Telehealth: Payer: Self-pay | Admitting: Emergency Medicine

## 2017-10-28 NOTE — Telephone Encounter (Signed)
Post ED Visit - Positive Culture Follow-up  Culture report reviewed by antimicrobial stewardship pharmacist:  []  Enzo BiNathan Batchelder, Pharm.D. []  Celedonio MiyamotoJeremy Frens, Pharm.D., BCPS AQ-ID []  Garvin FilaMike Maccia, Pharm.D., BCPS [x]  Georgina PillionElizabeth Martin, Pharm.D., BCPS []  Crouch MesaMinh Pham, VermontPharm.D., BCPS, AAHIVP []  Estella HuskMichelle Turner, Pharm.D., BCPS, AAHIVP []  Lysle Pearlachel Rumbarger, PharmD, BCPS []  Sherlynn CarbonAustin Lucas, PharmD []  Pollyann SamplesAndy Johnston, PharmD, BCPS  Positive urine culture Treated with bactrim DS organism sensitive to the same and no further patient follow-up is required at this time.  Berle MullMiller, Bush Murdoch 10/28/2017, 11:07 AM

## 2017-12-16 DIAGNOSIS — Z801 Family history of malignant neoplasm of trachea, bronchus and lung: Secondary | ICD-10-CM | POA: Diagnosis not present

## 2017-12-16 DIAGNOSIS — Z803 Family history of malignant neoplasm of breast: Secondary | ICD-10-CM | POA: Diagnosis not present

## 2017-12-16 DIAGNOSIS — Z6825 Body mass index (BMI) 25.0-25.9, adult: Secondary | ICD-10-CM | POA: Diagnosis not present

## 2017-12-16 DIAGNOSIS — Z01419 Encounter for gynecological examination (general) (routine) without abnormal findings: Secondary | ICD-10-CM | POA: Diagnosis not present

## 2018-02-10 DIAGNOSIS — Z6825 Body mass index (BMI) 25.0-25.9, adult: Secondary | ICD-10-CM | POA: Diagnosis not present

## 2018-02-10 DIAGNOSIS — Z1371 Encounter for nonprocreative screening for genetic disease carrier status: Secondary | ICD-10-CM | POA: Diagnosis not present

## 2018-02-13 DIAGNOSIS — E7800 Pure hypercholesterolemia, unspecified: Secondary | ICD-10-CM | POA: Diagnosis not present

## 2018-02-13 DIAGNOSIS — E538 Deficiency of other specified B group vitamins: Secondary | ICD-10-CM | POA: Diagnosis not present

## 2018-02-13 DIAGNOSIS — Z79899 Other long term (current) drug therapy: Secondary | ICD-10-CM | POA: Diagnosis not present

## 2018-02-13 DIAGNOSIS — M810 Age-related osteoporosis without current pathological fracture: Secondary | ICD-10-CM | POA: Diagnosis not present

## 2018-02-13 DIAGNOSIS — J309 Allergic rhinitis, unspecified: Secondary | ICD-10-CM | POA: Diagnosis not present

## 2018-05-23 DIAGNOSIS — J019 Acute sinusitis, unspecified: Secondary | ICD-10-CM | POA: Diagnosis not present

## 2018-05-23 DIAGNOSIS — R05 Cough: Secondary | ICD-10-CM | POA: Diagnosis not present

## 2018-06-11 ENCOUNTER — Other Ambulatory Visit: Payer: Self-pay | Admitting: Physician Assistant

## 2018-06-11 DIAGNOSIS — Z1231 Encounter for screening mammogram for malignant neoplasm of breast: Secondary | ICD-10-CM

## 2018-06-21 DIAGNOSIS — B349 Viral infection, unspecified: Secondary | ICD-10-CM | POA: Diagnosis not present

## 2018-07-23 ENCOUNTER — Ambulatory Visit: Payer: BLUE CROSS/BLUE SHIELD

## 2018-08-11 DIAGNOSIS — M810 Age-related osteoporosis without current pathological fracture: Secondary | ICD-10-CM | POA: Diagnosis not present

## 2018-08-11 DIAGNOSIS — E538 Deficiency of other specified B group vitamins: Secondary | ICD-10-CM | POA: Diagnosis not present

## 2018-08-11 DIAGNOSIS — E7800 Pure hypercholesterolemia, unspecified: Secondary | ICD-10-CM | POA: Diagnosis not present

## 2018-08-11 DIAGNOSIS — Z79899 Other long term (current) drug therapy: Secondary | ICD-10-CM | POA: Diagnosis not present

## 2018-08-11 DIAGNOSIS — J309 Allergic rhinitis, unspecified: Secondary | ICD-10-CM | POA: Diagnosis not present

## 2019-02-21 DIAGNOSIS — M545 Low back pain: Secondary | ICD-10-CM | POA: Diagnosis not present

## 2019-02-25 DIAGNOSIS — M5440 Lumbago with sciatica, unspecified side: Secondary | ICD-10-CM | POA: Diagnosis not present

## 2019-02-25 DIAGNOSIS — Z6823 Body mass index (BMI) 23.0-23.9, adult: Secondary | ICD-10-CM | POA: Diagnosis not present

## 2019-03-11 ENCOUNTER — Emergency Department (HOSPITAL_COMMUNITY): Payer: BLUE CROSS/BLUE SHIELD

## 2019-03-11 ENCOUNTER — Emergency Department (HOSPITAL_COMMUNITY)
Admission: EM | Admit: 2019-03-11 | Discharge: 2019-03-11 | Disposition: A | Discharge status: Home / Self Care | Payer: BLUE CROSS/BLUE SHIELD | Attending: Emergency Medicine | Admitting: Emergency Medicine

## 2019-03-11 ENCOUNTER — Other Ambulatory Visit: Payer: Self-pay

## 2019-03-11 DIAGNOSIS — Z7982 Long term (current) use of aspirin: Secondary | ICD-10-CM | POA: Insufficient documentation

## 2019-03-11 DIAGNOSIS — E538 Deficiency of other specified B group vitamins: Secondary | ICD-10-CM | POA: Diagnosis not present

## 2019-03-11 DIAGNOSIS — Z79899 Other long term (current) drug therapy: Secondary | ICD-10-CM | POA: Diagnosis not present

## 2019-03-11 DIAGNOSIS — R0982 Postnasal drip: Secondary | ICD-10-CM | POA: Diagnosis not present

## 2019-03-11 DIAGNOSIS — R519 Headache, unspecified: Secondary | ICD-10-CM | POA: Diagnosis not present

## 2019-03-11 DIAGNOSIS — Z1331 Encounter for screening for depression: Secondary | ICD-10-CM | POA: Diagnosis not present

## 2019-03-11 DIAGNOSIS — E7800 Pure hypercholesterolemia, unspecified: Secondary | ICD-10-CM | POA: Diagnosis not present

## 2019-03-11 LAB — CBC
HCT: 40.2 % (ref 36.0–46.0)
Hemoglobin: 13 g/dL (ref 12.0–15.0)
MCH: 27.8 pg (ref 26.0–34.0)
MCHC: 32.3 g/dL (ref 30.0–36.0)
MCV: 85.9 fL (ref 80.0–100.0)
Platelets: 232 10*3/uL (ref 150–400)
RBC: 4.68 MIL/uL (ref 3.87–5.11)
RDW: 12.9 % (ref 11.5–15.5)
WBC: 8.4 10*3/uL (ref 4.0–10.5)
nRBC: 0 % (ref 0.0–0.2)

## 2019-03-11 LAB — I-STAT CHEM 8, ED
BUN: 10 mg/dL (ref 6–20)
Calcium, Ion: 1.15 mmol/L (ref 1.15–1.40)
Chloride: 104 mmol/L (ref 98–111)
Creatinine, Ser: 0.7 mg/dL (ref 0.44–1.00)
Glucose, Bld: 108 mg/dL — ABNORMAL HIGH (ref 70–99)
HCT: 39 % (ref 36.0–46.0)
Hemoglobin: 13.3 g/dL (ref 12.0–15.0)
Potassium: 3.4 mmol/L — ABNORMAL LOW (ref 3.5–5.1)
Sodium: 140 mmol/L (ref 135–145)
TCO2: 23 mmol/L (ref 22–32)

## 2019-03-11 LAB — DIFFERENTIAL
Abs Immature Granulocytes: 0.03 10*3/uL (ref 0.00–0.07)
Basophils Absolute: 0 10*3/uL (ref 0.0–0.1)
Basophils Relative: 0 %
Eosinophils Absolute: 0 10*3/uL (ref 0.0–0.5)
Eosinophils Relative: 0 %
Immature Granulocytes: 0 %
Lymphocytes Relative: 13 %
Lymphs Abs: 1.1 10*3/uL (ref 0.7–4.0)
Monocytes Absolute: 0.5 10*3/uL (ref 0.1–1.0)
Monocytes Relative: 5 %
Neutro Abs: 6.8 10*3/uL (ref 1.7–7.7)
Neutrophils Relative %: 82 %

## 2019-03-11 LAB — COMPREHENSIVE METABOLIC PANEL
ALT: 16 U/L (ref 0–44)
AST: 17 U/L (ref 15–41)
Albumin: 4.3 g/dL (ref 3.5–5.0)
Alkaline Phosphatase: 69 U/L (ref 38–126)
Anion gap: 10 (ref 5–15)
BUN: 9 mg/dL (ref 6–20)
CO2: 23 mmol/L (ref 22–32)
Calcium: 9 mg/dL (ref 8.9–10.3)
Chloride: 107 mmol/L (ref 98–111)
Creatinine, Ser: 0.71 mg/dL (ref 0.44–1.00)
GFR calc Af Amer: >60 mL/min (ref >60)
GFR calc non Af Amer: >60 mL/min (ref >60)
Glucose, Bld: 111 mg/dL — ABNORMAL HIGH (ref 70–99)
Potassium: 3.4 mmol/L — ABNORMAL LOW (ref 3.5–5.1)
Sodium: 140 mmol/L (ref 135–145)
Total Bilirubin: 1 mg/dL (ref 0.3–1.2)
Total Protein: 6.7 g/dL (ref 6.5–8.1)

## 2019-03-11 LAB — I-STAT BETA HCG BLOOD, ED (MC, WL, AP ONLY): I-stat hCG, quantitative: <5 m[IU]/mL (ref <5)

## 2019-03-11 LAB — APTT: aPTT: 28 seconds (ref 24–36)

## 2019-03-11 LAB — PROTIME-INR
INR: 0.9 (ref 0.8–1.2)
Prothrombin Time: 12.4 seconds (ref 11.4–15.2)

## 2019-03-11 MED ORDER — SODIUM CHLORIDE 0.9% FLUSH
3.0000 mL | Freq: Once | INTRAVENOUS | Status: AC
  Administered 2019-03-11: 18:00:00 3 mL via INTRAVENOUS

## 2019-03-11 MED ORDER — SODIUM CHLORIDE 0.9 % IV BOLUS
500.0000 mL | Freq: Once | INTRAVENOUS | Status: AC
  Administered 2019-03-11: 18:00:00 500 mL via INTRAVENOUS

## 2019-03-11 MED ORDER — MAGNESIUM SULFATE IN D5W 1-5 GM/100ML-% IV SOLN
1.0000 g | Freq: Once | INTRAVENOUS | Status: AC
  Administered 2019-03-11: 1 g via INTRAVENOUS
  Filled 2019-03-11: qty 100

## 2019-03-11 MED ORDER — METOCLOPRAMIDE HCL 5 MG/ML IJ SOLN
10.0000 mg | Freq: Once | INTRAMUSCULAR | Status: AC
  Administered 2019-03-11: 18:00:00 10 mg via INTRAVENOUS
  Filled 2019-03-11: qty 2

## 2019-03-11 MED ORDER — DIPHENHYDRAMINE HCL 50 MG/ML IJ SOLN
25.0000 mg | Freq: Once | INTRAMUSCULAR | Status: AC
  Administered 2019-03-11: 25 mg via INTRAVENOUS
  Filled 2019-03-11: qty 1

## 2019-03-11 NOTE — ED Notes (Signed)
Patient verbalizes understanding of discharge instructions. Opportunity for questioning and answers were provided. Armband removed by staff, pt discharged from ED ambulatory.   

## 2019-03-11 NOTE — ED Notes (Addendum)
Spoke with pt.  Delay explained

## 2019-03-11 NOTE — ED Provider Notes (Signed)
[facility]   CSN: [id number] Arrival date & time: 03/11/19  1148     History   Chief Complaint Chief Complaint  Patient presents with  . Headache    HPI Cheyenne Hart is a 58 y.o. female w PMHx  HLD, fibromyalgia, presenting to the ED with complaint of gradual onset of frontal and occipital headache that began this morning. She endorses sinus pressure over the weekend.  Reports associated nausea, photophobia.  Reports this is the worst headache she has ever had though does have a history of headache.  Denies vomiting, phonophobia, vision changes, chest pain, numbness, weakness, slurred speech. Patient was seen by her PCP today who recommended ED evaluation.  No medications tried prior to arrival.     The history is provided by the patient.    Past Medical History:  Diagnosis Date  . Fibromyalgia   . High cholesterol   . Pyelonephritis     Patient Active Problem List   Diagnosis Date Noted  . Aphasia 02/11/2012  . Observed seizure-like activity (HCC) 02/11/2012  . Altered mental state 02/11/2012  . Elevated BP 02/11/2012    Past Surgical History:  Procedure Laterality Date  . ABDOMINAL HYSTERECTOMY       OB History   No obstetric history on file.      Home Medications    Prior to Admission medications   Medication Sig Start Date End Date Taking? Authorizing Provider  alendronate (FOSAMAX) 70 MG tablet Take 70 mg by mouth once a week. Take with a full glass of water on an empty stomach.    [provider]  aspirin (ASPIRIN EC) 81 MG EC tablet Take 1 tablet (81 mg total) by mouth daily. Swallow whole. 02/12/12   Laza, Sorin C, MD  cefUROXime (CEFTIN) 500 MG tablet Take 500 mg by mouth 2 (two) times daily.  08/01/14   [provider]  cetirizine (ZYRTEC) 10 MG tablet Take 10 mg by mouth daily.    [provider]  CHERATUSSIN AC 100-10 MG/5ML syrup Take 5 mLs by mouth 3 (three)  times daily as needed for cough.  08/01/14   [provider]  ciprofloxacin (CIPRO) 500 MG tablet Take 1 tablet (500 mg total) by mouth 2 (two) times daily. 08/02/14   Horton, Mayer Maskerourtney F, MD  HYDROcodone-acetaminophen (NORCO/VICODIN) 5-325 MG per tablet Take 1 tablet by mouth every 6 (six) hours as needed. 08/02/14   Horton, Mayer Maskerourtney F, MD  ondansetron (ZOFRAN ODT) 4 MG disintegrating tablet Take 1 tablet (4 mg total) by mouth every 8 (eight) hours as needed for nausea or vomiting. 08/02/14   Horton, Mayer Maskerourtney F, MD  phenazopyridine (PYRIDIUM) 200 MG tablet Take 1 tablet (200 mg total) by mouth 3 (three) times daily as needed for pain. 10/25/17   Doug SouJacubowitz, Sam, MD  simvastatin (ZOCOR) 10 MG tablet Take 1 tablet (10 mg total) by mouth at bedtime. 02/12/12   Lorane GellLaza, Sorin C, MD  SIMVASTATIN PO Take by mouth.    [provider]    Family History Family History  Problem Relation Age of Onset  . Breast cancer Sister   . Breast cancer Maternal Aunt     Social History Social History   Tobacco Use  . Smoking status: Never Smoker  . Smokeless tobacco: Never Used  Substance Use Topics  . Alcohol use: No  . Drug use: No     Allergies   Amoxicillin and Amoxicillin   Review of  Systems Review of Systems  Gastrointestinal: Positive for nausea. Negative for vomiting.  Neurological: Positive for headaches.  All other systems reviewed and are negative.    Physical Exam Updated Vital Signs BP (!) 168/95   Pulse 76   Temp 98.4 F (36.9 C) (Oral)   Resp 18   SpO2 100%   Physical Exam Vitals signs and nursing note reviewed.  Constitutional:      Appearance: She is well-developed.  HENT:     Head: Normocephalic and atraumatic.  Eyes:     Conjunctiva/sclera: Conjunctivae normal.  Neck:     Musculoskeletal: Normal range of motion and neck supple. No neck rigidity or muscular tenderness.  Cardiovascular:     Rate and Rhythm: Normal rate and regular rhythm.  Pulmonary:      Effort: Pulmonary effort is normal. No respiratory distress.     Breath sounds: Normal breath sounds.  Abdominal:     Palpations: Abdomen is soft.  Skin:    General: Skin is warm.  Neurological:     Mental Status: She is alert.     Comments: Mental Status:  Alert, oriented, thought content appropriate, able to give a coherent history. Speech fluent without evidence of aphasia. Able to follow 2 step commands without difficulty.  Cranial Nerves:  II:  Peripheral visual fields grossly normal, pupils equal, round, reactive to light III,IV, VI: ptosis not present, extra-ocular motions intact bilaterally  V,VII: smile symmetric, facial light touch sensation equal VIII: hearing grossly normal to voice  X: uvula elevates symmetrically  XI: bilateral shoulder shrug symmetric and strong XII: midline tongue extension without fassiculations Motor:  Normal tone. 5/5 in upper and lower extremities bilaterally including strong and equal grip strength and dorsiflexion/plantar flexion Sensory: grossly normal in all extremities.  Cerebellar: normal finger-to-nose with bilateral upper extremities CV: distal pulses palpable throughout    Psychiatric:        Behavior: Behavior normal.      ED Treatments / Results  Labs (all labs ordered are listed, but only abnormal results are displayed) Labs Reviewed  COMPREHENSIVE METABOLIC PANEL - Abnormal; Notable for the following components:      Result Value   Potassium 3.4 (*)    Glucose, Bld 111 (*)    All other components within normal limits  I-STAT CHEM 8, ED - Abnormal; Notable for the following components:   Potassium 3.4 (*)    Glucose, Bld 108 (*)    All other components within normal limits  PROTIME-INR  APTT  CBC  DIFFERENTIAL  CBG MONITORING, ED  I-STAT BETA HCG BLOOD, ED ([facility], [facility], [facility] ONLY)    EKG None  Radiology Ct Head Wo Contrast  Result Date: 03/11/2019 CLINICAL DATA:  Severe headache of acute onset EXAM: CT HEAD WITHOUT  CONTRAST TECHNIQUE: Contiguous axial images were obtained from the base of the skull through the vertex without intravenous contrast. COMPARISON:  February 11, 2012 FINDINGS: Brain: Ventricles are normal in size and configuration. There is no intracranial mass, hemorrhage, extra-axial fluid collection, or midline shift. The brain parenchyma appears unremarkable. No evident acute infarct. Vascular: No hyperdense vessel.  No vascular calcification evident. Skull: The bony calvarium appears intact. Sinuses/Orbits: There is mucosal thickening in multiple ethmoid air cells. Visualized paranasal sinuses elsewhere clear. There is mild leftward deviation of the nasal septum. Orbits appear symmetric bilaterally. Other: Mastoid air cells are clear. IMPRESSION: Brain parenchyma appears unremarkable.  No mass or hemorrhage. Mucosal thickening in several ethmoid air cells. Mild leftward deviation of  nasal septum. Electronically Signed   By: Bretta Bang III M.D.   On: 03/11/2019 12:58    Procedures Procedures (including critical care time)  Medications Ordered in ED Medications  sodium chloride flush (NS) 0.9 % injection 3 mL (3 mLs Intravenous Given 03/11/19 1753)  metoCLOPramide (REGLAN) injection 10 mg (10 mg Intravenous Given 03/11/19 1752)  diphenhydrAMINE (BENADRYL) injection 25 mg (25 mg Intravenous Given 03/11/19 1752)  magnesium sulfate IVPB 1 g 100 mL (0 g Intravenous Stopped 03/11/19 2054)  sodium chloride 0.9 % bolus 500 mL (0 mLs Intravenous Stopped 03/11/19 2054)     Initial Impression / Assessment and Plan / ED Course  I have reviewed the triage vital signs and the nursing notes.  Pertinent labs & imaging results that were available during my care of the patient were reviewed by me and considered in my medical decision making (see chart for details).        Patient presenting from PCP office with gradual onset of severe occipital and frontal headache that began this morning.  She was  sent to the ED for further evaluation.  She is noted to be hypertensive on arrival though appears very uncomfortable and anxious.  No focal neuro deficits.  No known history of hypertension.  Work-up obtained in triage reveals reassuring lab work and normal head CT.  She was treated with migraine cocktail with significant improvement in symptoms including significant improvement in blood pressure.  She feels well for discharge.  Patient discussed with Dr. Rhunette Croft.  Will discharge with instructions to follow-up closely with PCP and strict return precautions.  Recommend patient log her blood pressure at home as her hypertension is new.  Patient is agreeable to plan and safe for discharge.  Discussed results, findings, treatment and follow up. Patient advised of return precautions. Patient verbalized understanding and agreed with plan.   Final Clinical Impressions(s) / ED Diagnoses   Final diagnoses:  Acute nonintractable headache, unspecified headache type    ED Discharge Orders    None       Robinson, [place] N, PA-C 03/11/19 2224    Derwood Kaplan, MD 03/12/19 0028

## 2019-03-11 NOTE — ED Triage Notes (Signed)
Pt woke up with severe headache this morning at 0600, went to PCP for routine checkup and PCP sent her here for CT d/t headache. No meds PTA.

## 2019-03-11 NOTE — Discharge Instructions (Addendum)
Please read instructions below. You can take 600 mg of Advil/ibuprofen every 6 hours as needed for headache. Schedule an appointment with your primary care provider to follow up on your headache. Monitor your blood pressure at home to discuss with your primary care. Return to the ER for severely worsening headache, vision changes, fever, weakness or numbness, or new or concerning symptoms.

## 2019-03-18 DIAGNOSIS — J309 Allergic rhinitis, unspecified: Secondary | ICD-10-CM | POA: Diagnosis not present

## 2019-03-18 DIAGNOSIS — Z23 Encounter for immunization: Secondary | ICD-10-CM | POA: Diagnosis not present

## 2019-03-18 DIAGNOSIS — M810 Age-related osteoporosis without current pathological fracture: Secondary | ICD-10-CM | POA: Diagnosis not present

## 2019-03-18 DIAGNOSIS — E538 Deficiency of other specified B group vitamins: Secondary | ICD-10-CM | POA: Diagnosis not present

## 2019-03-18 DIAGNOSIS — E7800 Pure hypercholesterolemia, unspecified: Secondary | ICD-10-CM | POA: Diagnosis not present

## 2019-03-18 DIAGNOSIS — Z79899 Other long term (current) drug therapy: Secondary | ICD-10-CM | POA: Diagnosis not present

## 2019-04-19 DIAGNOSIS — Z043 Encounter for examination and observation following other accident: Secondary | ICD-10-CM | POA: Diagnosis not present

## 2019-04-19 DIAGNOSIS — Z6823 Body mass index (BMI) 23.0-23.9, adult: Secondary | ICD-10-CM | POA: Diagnosis not present

## 2019-04-19 DIAGNOSIS — M25552 Pain in left hip: Secondary | ICD-10-CM | POA: Diagnosis not present

## 2019-04-19 DIAGNOSIS — W19XXXA Unspecified fall, initial encounter: Secondary | ICD-10-CM | POA: Diagnosis not present

## 2021-09-10 DIAGNOSIS — S39012A Strain of muscle, fascia and tendon of lower back, initial encounter: Secondary | ICD-10-CM | POA: Diagnosis not present

## 2021-09-13 DIAGNOSIS — Z1231 Encounter for screening mammogram for malignant neoplasm of breast: Secondary | ICD-10-CM | POA: Diagnosis not present

## 2021-09-27 DIAGNOSIS — R928 Other abnormal and inconclusive findings on diagnostic imaging of breast: Secondary | ICD-10-CM | POA: Diagnosis not present

## 2021-10-04 DIAGNOSIS — E7800 Pure hypercholesterolemia, unspecified: Secondary | ICD-10-CM | POA: Diagnosis not present

## 2021-10-04 DIAGNOSIS — M810 Age-related osteoporosis without current pathological fracture: Secondary | ICD-10-CM | POA: Diagnosis not present

## 2021-10-04 DIAGNOSIS — I10 Essential (primary) hypertension: Secondary | ICD-10-CM | POA: Diagnosis not present

## 2021-10-04 DIAGNOSIS — I679 Cerebrovascular disease, unspecified: Secondary | ICD-10-CM | POA: Diagnosis not present

## 2021-10-04 DIAGNOSIS — R739 Hyperglycemia, unspecified: Secondary | ICD-10-CM | POA: Diagnosis not present

## 2021-10-04 DIAGNOSIS — J309 Allergic rhinitis, unspecified: Secondary | ICD-10-CM | POA: Diagnosis not present

## 2021-10-08 ENCOUNTER — Other Ambulatory Visit: Payer: Self-pay | Admitting: Family Medicine

## 2021-10-08 DIAGNOSIS — M810 Age-related osteoporosis without current pathological fracture: Secondary | ICD-10-CM

## 2022-04-09 DIAGNOSIS — I679 Cerebrovascular disease, unspecified: Secondary | ICD-10-CM | POA: Diagnosis not present

## 2022-04-09 DIAGNOSIS — R7303 Prediabetes: Secondary | ICD-10-CM | POA: Diagnosis not present

## 2022-04-09 DIAGNOSIS — J309 Allergic rhinitis, unspecified: Secondary | ICD-10-CM | POA: Diagnosis not present

## 2022-04-09 DIAGNOSIS — Z1331 Encounter for screening for depression: Secondary | ICD-10-CM | POA: Diagnosis not present

## 2022-04-09 DIAGNOSIS — I10 Essential (primary) hypertension: Secondary | ICD-10-CM | POA: Diagnosis not present

## 2022-04-09 DIAGNOSIS — E7800 Pure hypercholesterolemia, unspecified: Secondary | ICD-10-CM | POA: Diagnosis not present

## 2022-04-09 DIAGNOSIS — M810 Age-related osteoporosis without current pathological fracture: Secondary | ICD-10-CM | POA: Diagnosis not present

## 2023-08-14 ENCOUNTER — Emergency Department (HOSPITAL_COMMUNITY)

## 2023-08-14 ENCOUNTER — Other Ambulatory Visit: Payer: Self-pay

## 2023-08-14 ENCOUNTER — Emergency Department (HOSPITAL_COMMUNITY)
Admission: EM | Admit: 2023-08-14 | Discharge: 2023-08-14 | Disposition: A | Discharge status: Home / Self Care | Attending: Emergency Medicine | Admitting: Emergency Medicine

## 2023-08-14 ENCOUNTER — Encounter (HOSPITAL_COMMUNITY): Payer: Self-pay

## 2023-08-14 DIAGNOSIS — W19XXXA Unspecified fall, initial encounter: Secondary | ICD-10-CM

## 2023-08-14 DIAGNOSIS — S39012A Strain of muscle, fascia and tendon of lower back, initial encounter: Secondary | ICD-10-CM | POA: Diagnosis not present

## 2023-08-14 DIAGNOSIS — R0689 Other abnormalities of breathing: Secondary | ICD-10-CM | POA: Diagnosis not present

## 2023-08-14 DIAGNOSIS — M47816 Spondylosis without myelopathy or radiculopathy, lumbar region: Secondary | ICD-10-CM | POA: Diagnosis not present

## 2023-08-14 DIAGNOSIS — M546 Pain in thoracic spine: Secondary | ICD-10-CM | POA: Diagnosis not present

## 2023-08-14 DIAGNOSIS — R000 Tachycardia, unspecified: Secondary | ICD-10-CM | POA: Diagnosis not present

## 2023-08-14 DIAGNOSIS — M47814 Spondylosis without myelopathy or radiculopathy, thoracic region: Secondary | ICD-10-CM | POA: Diagnosis not present

## 2023-08-14 DIAGNOSIS — I10 Essential (primary) hypertension: Secondary | ICD-10-CM | POA: Insufficient documentation

## 2023-08-14 DIAGNOSIS — S3992XA Unspecified injury of lower back, initial encounter: Secondary | ICD-10-CM | POA: Diagnosis not present

## 2023-08-14 DIAGNOSIS — W108XXA Fall (on) (from) other stairs and steps, initial encounter: Secondary | ICD-10-CM | POA: Diagnosis not present

## 2023-08-14 DIAGNOSIS — M545 Low back pain, unspecified: Secondary | ICD-10-CM | POA: Diagnosis not present

## 2023-08-14 DIAGNOSIS — S299XXA Unspecified injury of thorax, initial encounter: Secondary | ICD-10-CM | POA: Diagnosis not present

## 2023-08-14 HISTORY — DX: Essential (primary) hypertension: I10

## 2023-08-14 MED ORDER — LORAZEPAM 1 MG PO TABS
1.0000 mg | ORAL_TABLET | Freq: Once | ORAL | Status: AC
  Administered 2023-08-14: 1 mg via ORAL
  Filled 2023-08-14: qty 1

## 2023-08-14 MED ORDER — METHOCARBAMOL 500 MG PO TABS: 500.0000 mg | ORAL_TABLET | Freq: Three times a day (TID) | ORAL | 0 refills | Status: AC | PRN

## 2023-08-14 MED ORDER — KETOROLAC TROMETHAMINE 30 MG/ML IJ SOLN
30.0000 mg | Freq: Once | INTRAMUSCULAR | Status: AC
  Administered 2023-08-14: 30 mg via INTRAVENOUS
  Filled 2023-08-14: qty 1

## 2023-08-14 NOTE — ED Provider Notes (Signed)
 Emergency Department Provider Note   I have reviewed the triage vital signs and the nursing notes.   HISTORY  Chief Complaint Fall   HPI Cheyenne Hart is a 63 y.o. female presents to the emergency department for evaluation of pain after falling off of the bleachers.  She states someone caused the bleachers to break when she fell hitting her lower back on the bleacher behind her.  She is having pain in her low back and sacrum area neck or head pain.  No numbness or weakness in the legs.   Past Medical History:  Diagnosis Date   Fibromyalgia    High cholesterol    Hypertension    Pyelonephritis     Review of Systems  Constitutional: No fever/chills Cardiovascular: Denies chest pain. Respiratory: Denies shortness of breath. Gastrointestinal: No abdominal pain.  No nausea, no vomiting.  Musculoskeletal: Positive back pain.  Skin: Negative for rash. Neurological: Negative for numbness/weakness.   ____________________________________________   PHYSICAL EXAM:  VITAL SIGNS: ED Triage Vitals  Encounter Vitals Group     BP 08/14/23 2031 (!) 165/92     Pulse Rate 08/14/23 2031 (!) 119     Resp 08/14/23 2031 20     Temp 08/14/23 2031 98.4 F (36.9 C)     Temp Source 08/14/23 2031 Oral     SpO2 08/14/23 2026 100 %   Constitutional: Alert and oriented. Well appearing and in no acute distress. Eyes: Conjunctivae are normal.  Head: Atraumatic. Nose: No congestion/rhinnorhea. Mouth/Throat: Mucous membranes are moist. Neck: No stridor.  No cervical spine tenderness to palpation. Cardiovascular: Normal rate, regular rhythm. Good peripheral circulation. Grossly normal heart sounds.   Respiratory: Normal respiratory effort.  No retractions. Lungs CTAB. Gastrointestinal: Soft and nontender. No distention.  Musculoskeletal: Patient with diffuse peri-spinal tenderness in the thoracic/lumbar region.  Neurologic:  Normal speech and language. No gross focal neurologic  deficits are appreciated.  Skin:  Skin is warm, dry and intact. No rash noted.  ____________________________________________  RADIOLOGY  CT Thoracic Spine Wo Contrast Result Date: 08/14/2023 CLINICAL DATA:  Spine fracture, thoracic, traumatic; Ataxia, lumbar trauma EXAM: CT THORACIC AND LUMBAR SPINE WITHOUT CONTRAST TECHNIQUE: Multidetector CT imaging of the thoracic and lumbar spine was performed without contrast. Multiplanar CT image reconstructions were also generated. RADIATION DOSE REDUCTION: This exam was performed according to the departmental dose-optimization program which includes automated exposure control, adjustment of the mA and/or kV according to patient size and/or use of iterative reconstruction technique. COMPARISON:  None Available. FINDINGS: CT THORACIC SPINE FINDINGS Alignment: Normal. Vertebrae: Diffusely decreased bone density. Multilevel degenerative change of the spine with Schmorl node formation. No associated severe osseous neural foraminal or central canal stenosis. No acute fracture or focal pathologic process. Paraspinal and other soft tissues: Negative. Disc levels: Maintained. CT LUMBAR SPINE FINDINGS Segmentation: 5 lumbar type vertebrae. Alignment: Normal. Vertebrae: Multilevel degenerative change of the spine with Schmorl node formation. No associated severe osseous neural foraminal or central canal stenosis.No acute fracture or focal pathologic process. Paraspinal and other soft tissues: Negative. Disc levels: Maintained. IMPRESSION: CT THORACIC SPINE IMPRESSION No acute displaced fracture or traumatic listhesis of the thoracic spine. CT LUMBAR SPINE IMPRESSION No acute displaced fracture or traumatic listhesis of the lumbar spine. Electronically Signed   By: Tish Frederickson M.D.   On: 08/14/2023 22:15   CT Lumbar Spine Wo Contrast Result Date: 08/14/2023 CLINICAL DATA:  Spine fracture, thoracic, traumatic; Ataxia, lumbar trauma EXAM: CT THORACIC AND LUMBAR SPINE  WITHOUT CONTRAST  TECHNIQUE: Multidetector CT imaging of the thoracic and lumbar spine was performed without contrast. Multiplanar CT image reconstructions were also generated. RADIATION DOSE REDUCTION: This exam was performed according to the departmental dose-optimization program which includes automated exposure control, adjustment of the mA and/or kV according to patient size and/or use of iterative reconstruction technique. COMPARISON:  None Available. FINDINGS: CT THORACIC SPINE FINDINGS Alignment: Normal. Vertebrae: Diffusely decreased bone density. Multilevel degenerative change of the spine with Schmorl node formation. No associated severe osseous neural foraminal or central canal stenosis. No acute fracture or focal pathologic process. Paraspinal and other soft tissues: Negative. Disc levels: Maintained. CT LUMBAR SPINE FINDINGS Segmentation: 5 lumbar type vertebrae. Alignment: Normal. Vertebrae: Multilevel degenerative change of the spine with Schmorl node formation. No associated severe osseous neural foraminal or central canal stenosis.No acute fracture or focal pathologic process. Paraspinal and other soft tissues: Negative. Disc levels: Maintained. IMPRESSION: CT THORACIC SPINE IMPRESSION No acute displaced fracture or traumatic listhesis of the thoracic spine. CT LUMBAR SPINE IMPRESSION No acute displaced fracture or traumatic listhesis of the lumbar spine. Electronically Signed   By: Tish Frederickson M.D.   On: 08/14/2023 22:15    ____________________________________________   PROCEDURES  Procedure(s) performed:   Procedures  None  ____________________________________________   INITIAL IMPRESSION / ASSESSMENT AND PLAN / ED COURSE  Pertinent labs & imaging results that were available during my care of the patient were reviewed by me and considered in my medical decision making (see chart for details).   This patient is Presenting for Evaluation of back pain, which does require a  range of treatment options, and is a complaint that involves a high risk of morbidity and mortality.  The Differential Diagnoses includes but is not exclusive to musculoskeletal back pain, renal colic, urinary tract infection, pyelonephritis, intra-abdominal causes of back pain, aortic aneurysm or dissection, cauda equina syndrome, sciatica, lumbar disc disease, thoracic disc disease, etc.   Critical Interventions-    Medications  ketorolac (TORADOL) 30 MG/ML injection 30 mg (30 mg Intravenous Given 08/14/23 2129)  LORazepam (ATIVAN) tablet 1 mg (1 mg Oral Given 08/14/23 2304)    Reassessment after intervention: pain improved.   Radiologic Tests Ordered, included spine XR. I independently interpreted the images and agree with radiology interpretation.   Medical Decision Making: Summary:  The patient presents to the emergency department for evaluation of back pain after fall off the bleachers.  No neurodeficits.  Plan for CT thoracic and lumbar spine.  Reevaluation with update and discussion with patient. No acute fracture on CT. Plan for symptom mgmt and PCP follow up.   Patient's presentation is most consistent with acute presentation with potential threat to life or bodily function.   Disposition: discharge  ____________________________________________  FINAL CLINICAL IMPRESSION(S) / ED DIAGNOSES  Final diagnoses:  Fall, initial encounter  Back strain, initial encounter     NEW OUTPATIENT MEDICATIONS STARTED DURING THIS VISIT:  Discharge Medication List as of 08/14/2023 10:53 PM     START taking these medications   Details  methocarbamol (ROBAXIN) 500 MG tablet Take 1 tablet (500 mg total) by mouth every 8 (eight) hours as needed for muscle spasms., Starting Thu 08/14/2023, Normal        Note:  This document was prepared using Dragon voice recognition software and may include unintentional dictation errors.  Alona Bene, MD, [facility] Emergency Medicine    Tonnie Stillman, Arlyss Repress, MD 08/25/23 [id number]

## 2023-08-14 NOTE — ED Triage Notes (Signed)
 Pt was sitting approx 6ft high on bleachers when someone jumped on the bleachers causing them to break. Pt fell hitting her lower back on bleacher and landed on her sacrum. Pt complaining of centralized mid to lower back pain. Denies neck pain. Denies LOC.

## 2023-08-14 NOTE — Discharge Instructions (Signed)
You have been seen in the Emergency Department (ED)  today for back pain.  Your workup and exam have not shown any acute abnormalities and you are likely suffering from muscle strain or possible problems with your discs, but there is no treatment that will fix your symptoms at this time.  Please take Motrin (ibuprofen) as needed for your pain according to the instructions written on the box.  Alternatively, for the next five days you can take 600mg  three times daily with meals (it may upset your stomach).  Please follow up with your doctor as soon as possible regarding today's ED visit and your back pain.  Return to the ED for worsening back pain, fever, weakness or numbness of either leg, or if you develop either (1) an inability to urinate or have bowel movements, or (2) loss of your ability to control your bathroom functions (if you start having "accidents"), or if you develop other new symptoms that concern you.

## 2023-08-26 DIAGNOSIS — M79644 Pain in right finger(s): Secondary | ICD-10-CM | POA: Diagnosis not present

## 2023-08-28 DIAGNOSIS — M79644 Pain in right finger(s): Secondary | ICD-10-CM | POA: Diagnosis not present

## 2023-09-09 DIAGNOSIS — M79644 Pain in right finger(s): Secondary | ICD-10-CM | POA: Diagnosis not present

## 2023-09-18 DIAGNOSIS — M79644 Pain in right finger(s): Secondary | ICD-10-CM | POA: Diagnosis not present

## 2023-09-23 DIAGNOSIS — M79644 Pain in right finger(s): Secondary | ICD-10-CM | POA: Diagnosis not present

## 2023-09-30 DIAGNOSIS — M79644 Pain in right finger(s): Secondary | ICD-10-CM | POA: Diagnosis not present

## 2023-10-07 DIAGNOSIS — S6991XD Unspecified injury of right wrist, hand and finger(s), subsequent encounter: Secondary | ICD-10-CM | POA: Diagnosis not present

## 2023-10-07 DIAGNOSIS — M79644 Pain in right finger(s): Secondary | ICD-10-CM | POA: Diagnosis not present

## 2023-10-23 DIAGNOSIS — I10 Essential (primary) hypertension: Secondary | ICD-10-CM | POA: Diagnosis not present

## 2023-10-23 DIAGNOSIS — H9193 Unspecified hearing loss, bilateral: Secondary | ICD-10-CM | POA: Diagnosis not present

## 2023-10-23 DIAGNOSIS — I679 Cerebrovascular disease, unspecified: Secondary | ICD-10-CM | POA: Diagnosis not present

## 2023-10-23 DIAGNOSIS — R7303 Prediabetes: Secondary | ICD-10-CM | POA: Diagnosis not present

## 2023-10-23 DIAGNOSIS — H9313 Tinnitus, bilateral: Secondary | ICD-10-CM | POA: Diagnosis not present

## 2023-10-23 DIAGNOSIS — E2839 Other primary ovarian failure: Secondary | ICD-10-CM | POA: Diagnosis not present

## 2023-10-23 DIAGNOSIS — M810 Age-related osteoporosis without current pathological fracture: Secondary | ICD-10-CM | POA: Diagnosis not present

## 2023-10-23 DIAGNOSIS — Z1231 Encounter for screening mammogram for malignant neoplasm of breast: Secondary | ICD-10-CM | POA: Diagnosis not present

## 2023-10-23 DIAGNOSIS — J309 Allergic rhinitis, unspecified: Secondary | ICD-10-CM | POA: Diagnosis not present

## 2023-10-23 DIAGNOSIS — E7800 Pure hypercholesterolemia, unspecified: Secondary | ICD-10-CM | POA: Diagnosis not present

## 2023-11-24 DIAGNOSIS — M545 Low back pain, unspecified: Secondary | ICD-10-CM | POA: Diagnosis not present

## 2023-12-12 DIAGNOSIS — M545 Low back pain, unspecified: Secondary | ICD-10-CM | POA: Diagnosis not present

## 2023-12-15 DIAGNOSIS — M545 Low back pain, unspecified: Secondary | ICD-10-CM | POA: Diagnosis not present

## 2023-12-25 DIAGNOSIS — M545 Low back pain, unspecified: Secondary | ICD-10-CM | POA: Diagnosis not present

## 2023-12-30 DIAGNOSIS — M545 Low back pain, unspecified: Secondary | ICD-10-CM | POA: Diagnosis not present

## 2024-01-08 DIAGNOSIS — M545 Low back pain, unspecified: Secondary | ICD-10-CM | POA: Diagnosis not present

## 2024-01-13 DIAGNOSIS — M545 Low back pain, unspecified: Secondary | ICD-10-CM | POA: Diagnosis not present

## 2024-01-15 DIAGNOSIS — M545 Low back pain, unspecified: Secondary | ICD-10-CM | POA: Diagnosis not present

## 2024-03-18 DIAGNOSIS — Z1231 Encounter for screening mammogram for malignant neoplasm of breast: Secondary | ICD-10-CM | POA: Diagnosis not present

## 2024-04-21 DIAGNOSIS — N390 Urinary tract infection, site not specified: Secondary | ICD-10-CM | POA: Diagnosis not present

## 2024-05-04 DIAGNOSIS — I679 Cerebrovascular disease, unspecified: Secondary | ICD-10-CM | POA: Diagnosis not present

## 2024-05-04 DIAGNOSIS — R7303 Prediabetes: Secondary | ICD-10-CM | POA: Diagnosis not present

## 2024-05-04 DIAGNOSIS — E7800 Pure hypercholesterolemia, unspecified: Secondary | ICD-10-CM | POA: Diagnosis not present

## 2024-05-04 DIAGNOSIS — Z1331 Encounter for screening for depression: Secondary | ICD-10-CM | POA: Diagnosis not present

## 2024-05-04 DIAGNOSIS — M810 Age-related osteoporosis without current pathological fracture: Secondary | ICD-10-CM | POA: Diagnosis not present

## 2024-05-04 DIAGNOSIS — J309 Allergic rhinitis, unspecified: Secondary | ICD-10-CM | POA: Diagnosis not present

## 2024-05-04 DIAGNOSIS — I10 Essential (primary) hypertension: Secondary | ICD-10-CM | POA: Diagnosis not present
